# Patient Record
Sex: Male | Born: 1960 | Race: Black or African American | Hispanic: No | Marital: Single | Smoking: Former smoker
Health system: Southern US, Community
[De-identification: ages and names within clinical notes are randomized; demographics above are authoritative.]

## PROBLEM LIST (undated history)

## (undated) ENCOUNTER — Ambulatory Visit (HOSPITAL_COMMUNITY): Admission: EM | Payer: Medicare Other | Source: Home / Self Care

## (undated) DIAGNOSIS — J302 Other seasonal allergic rhinitis: Secondary | ICD-10-CM

## (undated) DIAGNOSIS — R4189 Other symptoms and signs involving cognitive functions and awareness: Secondary | ICD-10-CM

## (undated) DIAGNOSIS — K029 Dental caries, unspecified: Secondary | ICD-10-CM

## (undated) DIAGNOSIS — F419 Anxiety disorder, unspecified: Secondary | ICD-10-CM

## (undated) DIAGNOSIS — M1990 Unspecified osteoarthritis, unspecified site: Secondary | ICD-10-CM

## (undated) HISTORY — PX: NO PAST SURGERIES: SHX2092

---

## 2001-06-21 ENCOUNTER — Encounter: Payer: Self-pay | Admitting: Emergency Medicine

## 2001-06-21 ENCOUNTER — Emergency Department (HOSPITAL_COMMUNITY): Admission: EM | Admit: 2001-06-21 | Discharge: 2001-06-21 | Payer: Self-pay | Admitting: Emergency Medicine

## 2003-09-22 ENCOUNTER — Emergency Department (HOSPITAL_COMMUNITY): Admission: EM | Admit: 2003-09-22 | Discharge: 2003-09-22 | Payer: Self-pay | Admitting: Emergency Medicine

## 2008-03-02 ENCOUNTER — Emergency Department (HOSPITAL_COMMUNITY): Admission: EM | Admit: 2008-03-02 | Discharge: 2008-03-02 | Payer: Self-pay | Admitting: Emergency Medicine

## 2008-04-02 ENCOUNTER — Emergency Department (HOSPITAL_COMMUNITY): Admission: EM | Admit: 2008-04-02 | Discharge: 2008-04-02 | Payer: Self-pay | Admitting: Emergency Medicine

## 2009-01-24 ENCOUNTER — Emergency Department (HOSPITAL_COMMUNITY): Admission: EM | Admit: 2009-01-24 | Discharge: 2009-01-24 | Payer: Self-pay | Admitting: Emergency Medicine

## 2009-07-07 ENCOUNTER — Emergency Department (HOSPITAL_COMMUNITY): Admission: EM | Admit: 2009-07-07 | Discharge: 2009-07-07 | Payer: Self-pay | Admitting: Emergency Medicine

## 2014-09-11 ENCOUNTER — Other Ambulatory Visit: Payer: Self-pay | Admitting: Orthopedic Surgery

## 2014-09-14 ENCOUNTER — Other Ambulatory Visit: Payer: Self-pay | Admitting: Orthopedic Surgery

## 2014-09-18 ENCOUNTER — Encounter (HOSPITAL_COMMUNITY): Payer: Self-pay

## 2014-09-18 ENCOUNTER — Ambulatory Visit (HOSPITAL_COMMUNITY)
Admission: RE | Admit: 2014-09-18 | Discharge: 2014-09-18 | Disposition: A | Discharge status: Home / Self Care | Payer: Medicare Other | Source: Ambulatory Visit | Attending: Orthopedic Surgery | Admitting: Orthopedic Surgery

## 2014-09-18 ENCOUNTER — Encounter (HOSPITAL_COMMUNITY)
Admission: RE | Admit: 2014-09-18 | Discharge: 2014-09-18 | Disposition: A | Discharge status: Home / Self Care | Payer: Medicare Other | Source: Ambulatory Visit | Attending: Orthopedic Surgery | Admitting: Orthopedic Surgery

## 2014-09-18 DIAGNOSIS — M879 Osteonecrosis, unspecified: Secondary | ICD-10-CM | POA: Insufficient documentation

## 2014-09-18 DIAGNOSIS — Z01818 Encounter for other preprocedural examination: Secondary | ICD-10-CM | POA: Diagnosis not present

## 2014-09-18 DIAGNOSIS — Z0183 Encounter for blood typing: Secondary | ICD-10-CM | POA: Diagnosis not present

## 2014-09-18 DIAGNOSIS — Z01812 Encounter for preprocedural laboratory examination: Secondary | ICD-10-CM | POA: Insufficient documentation

## 2014-09-18 HISTORY — DX: Other seasonal allergic rhinitis: J30.2

## 2014-09-18 HISTORY — DX: Unspecified osteoarthritis, unspecified site: M19.90

## 2014-09-18 LAB — BASIC METABOLIC PANEL
ANION GAP: 9 (ref 5–15)
BUN: 13 mg/dL (ref 6–23)
CO2: 26 mmol/L (ref 19–32)
Calcium: 8.9 mg/dL (ref 8.4–10.5)
Chloride: 101 mmol/L (ref 96–112)
Creatinine, Ser: 0.95 mg/dL (ref 0.50–1.35)
GLUCOSE: 89 mg/dL (ref 70–99)
POTASSIUM: 3.9 mmol/L (ref 3.5–5.1)
SODIUM: 136 mmol/L (ref 135–145)

## 2014-09-18 LAB — URINALYSIS, ROUTINE W REFLEX MICROSCOPIC
Bilirubin Urine: NEGATIVE
GLUCOSE, UA: NEGATIVE mg/dL
HGB URINE DIPSTICK: NEGATIVE
Ketones, ur: NEGATIVE mg/dL
LEUKOCYTES UA: NEGATIVE
Nitrite: NEGATIVE
Protein, ur: NEGATIVE mg/dL
SPECIFIC GRAVITY, URINE: 1.021 (ref 1.005–1.030)
UROBILINOGEN UA: 1 mg/dL (ref 0.0–1.0)
pH: 6.5 (ref 5.0–8.0)

## 2014-09-18 LAB — PROTIME-INR
INR: 1.03 (ref 0.00–1.49)
Prothrombin Time: 13.6 seconds (ref 11.6–15.2)

## 2014-09-18 LAB — ABO/RH: ABO/RH(D): O POS

## 2014-09-18 LAB — CBC WITH DIFFERENTIAL/PLATELET
BASOS PCT: 0 % (ref 0–1)
Basophils Absolute: 0 10*3/uL (ref 0.0–0.1)
Eosinophils Absolute: 0.1 10*3/uL (ref 0.0–0.7)
Eosinophils Relative: 1 % (ref 0–5)
HEMATOCRIT: 39.2 % (ref 39.0–52.0)
HEMOGLOBIN: 13.4 g/dL (ref 13.0–17.0)
LYMPHS ABS: 1.9 10*3/uL (ref 0.7–4.0)
Lymphocytes Relative: 29 % (ref 12–46)
MCH: 34 pg (ref 26.0–34.0)
MCHC: 34.2 g/dL (ref 30.0–36.0)
MCV: 99.5 fL (ref 78.0–100.0)
MONO ABS: 0.6 10*3/uL (ref 0.1–1.0)
MONOS PCT: 9 % (ref 3–12)
NEUTROS ABS: 4 10*3/uL (ref 1.7–7.7)
Neutrophils Relative %: 61 % (ref 43–77)
Platelets: 307 10*3/uL (ref 150–400)
RBC: 3.94 MIL/uL — AB (ref 4.22–5.81)
RDW: 11.5 % (ref 11.5–15.5)
WBC: 6.6 10*3/uL (ref 4.0–10.5)

## 2014-09-18 LAB — TYPE AND SCREEN
ABO/RH(D): O POS
Antibody Screen: NEGATIVE

## 2014-09-18 LAB — SURGICAL PCR SCREEN
MRSA, PCR: NEGATIVE
Staphylococcus aureus: NEGATIVE

## 2014-09-18 LAB — APTT: APTT: 32 s (ref 24–37)

## 2014-09-18 NOTE — Pre-Procedure Instructions (Signed)
Dale BullsHarold J Ross  09/18/2014   Your procedure is scheduled on:  Wednesday, May 4.              Report to [facility] Admitting at 10:45 AM.   Call this number if you have problems the morning of surgery: [phone]                For any other questions, please call [phone], Monday - Friday 8 AM - 4 PM.   Remember:   Do not eat food or drink liquids after midnight Tuesday, May 4.   Take these medicines the morning of surgery with A SIP OF WATER: -   Do not wear jewelry, make-up or nail polish.  Do not wear lotions, powders, or perfumes.               Men may shave face and neck.  Do not bring valuables to the hospital.             [facility] is not responsible for any belongings or valuables.               Contacts, dentures or bridgework may not be worn into surgery.  Leave suitcase in the car. After surgery it may be brought to your room.  For patients admitted to the hospital, discharge time is determined by your treatment team.                 Special Instructions: Review  [place] - Preparing For Surgery.   Please read over the following fact sheets that you were given: Pain Booklet, Coughing and Deep Breathing, Blood Transfusion Information and Surgical Site Infection Prevention and Incentive Spirometry

## 2014-09-18 NOTE — Pre-Procedure Instructions (Signed)
Dale BullsHarold J Ross  09/18/2014   Your procedure is scheduled on:  Wednesday, May 4.              [facility] at 10:45 AM.   Call this number if you have problems the morning of surgery: [phone]                For any other questions, please call [phone], Monday - Friday 8 AM - 4 PM.   Remember:   Do not eat food or drink liquids after midnight Tuesday, May 4.   Take these medicines the morning of surgery with A SIP OF WATER: - Tylenol.                 Stop taking nabumetone (RELAFEN) on Wednesday, April 27.               Men may shave face and neck.  Do not bring valuables to the hospital.             [facility] is not responsible              Contacts, dentures or bridgework may not be worn into surgery.  Leave suitcase in the car. After surgery it may be brought to your room.  For patients admitted to the hospital, discharge time is determined by your treatment team.                 Special Instructions: Review  [place] - Preparing For Surgery.   Please read over the following fact sheets that you were given: Pain Booklet, Coughing and Deep Breathing, Blood Transfusion Information and Surgical Site Infection Prevention and Incentive Spirometry

## 2014-09-29 MED ORDER — CEFAZOLIN SODIUM-DEXTROSE 2-3 GM-% IV SOLR
2.0000 g | INTRAVENOUS | Status: AC
  Administered 2014-09-30: 2 g via INTRAVENOUS
  Filled 2014-09-29: qty 50

## 2014-09-29 MED ORDER — DEXTROSE-NACL 5-0.45 % IV SOLN: INTRAVENOUS | Status: DC

## 2014-09-29 NOTE — H&P (Signed)
TOTAL HIP ADMISSION H&P  Patient is admitted for left total hip arthroplasty.  Subjective:  Chief Complaint: left hip pain  HPI: Dale Ross, 54 y.o. male, has a history of pain and functional disability in the left hip(s) due to arthritis and patient has failed non-surgical conservative treatments for greater than 12 weeks to include NSAID's and/or analgesics, flexibility and strengthening excercises, weight reduction as appropriate and activity modification.  Onset of symptoms was gradual starting several years ago with gradually worsening course since that time.The patient noted no past surgery on the left hip(s).  Patient currently rates pain in the left hip at 10 out of 10 with activity. Patient has night pain, worsening of pain with activity and weight bearing, trendelenberg gait, pain that interfers with activities of daily living and pain with passive range of motion. Patient has evidence of joint space narrowing by imaging studies. This condition presents safety issues increasing the risk of falls. This patient has had AVN.  There is no current active infection.  There are no active problems to display for this patient.  Past Medical History  Diagnosis Date  . Seasonal allergies   . Arthritis     Past Surgical History  Procedure Laterality Date  . No past surgeries      No prescriptions prior to admission   No Known Allergies  History  Substance Use Topics  . Smoking status: Current Every Day Smoker -- 1.00 packs/day for 15 years  . Smokeless tobacco: Not on file  . Alcohol Use: 2.4 oz/week    4 Cans of beer per week    No family history on file.   Review of Systems  Constitutional: Negative.   HENT:       Sinus problems  Eyes: Negative.   Respiratory: Negative.   Cardiovascular: Negative.   Gastrointestinal: Negative.   Genitourinary: Negative.   Musculoskeletal: Positive for myalgias and joint pain.  Skin: Negative.   Neurological: Negative.    Psychiatric/Behavioral: Negative.     Objective:  Physical Exam  Constitutional: He is oriented to person, place, and time. He appears well-developed and well-nourished.  HENT:  Head: Normocephalic and atraumatic.  Eyes: Pupils are equal, round, and reactive to light.  Neck: Normal range of motion. Neck supple.  Cardiovascular: Intact distal pulses.   Respiratory: Effort normal.  Musculoskeletal: He exhibits tenderness.  The patient has a severely antalgic gait on the left side.  Any attempts at internal or external rotation causes severe pain.  His skin is intact he has normal sensation of the lower extremities.  Normal pulses to the foot no cuts.  Scrapes or abrasions.  Full range of motion of the knees, no effusions.  The right hip does not have any irritability unlike the left.    Neurological: He is alert and oriented to person, place, and time.  Skin: Skin is warm and dry.  Psychiatric: He has a normal mood and affect. His behavior is normal. Judgment and thought content normal.    Vital signs in last 24 hours:    Labs:   There is no height or weight on file to calculate BMI.   Imaging Review X-rays and MRI scan are reviewed and except for the femoral head, there is no abnormality to the left hip.  Assessment/Plan:  End stage arthritis and AVN, left hip(s)  The patient history, physical examination, clinical judgement of the provider and imaging studies are consistent with end stage degenerative joint disease of the left hip(s) and  total hip arthroplasty is deemed medically necessary. The treatment options including medical management, injection therapy, arthroscopy and arthroplasty were discussed at length. The risks and benefits of total hip arthroplasty were presented and reviewed. The risks due to aseptic loosening, infection, stiffness, dislocation/subluxation,  thromboembolic complications and other imponderables were discussed.  The patient acknowledged the  explanation, agreed to proceed with the plan and consent was signed. Patient is being admitted for inpatient treatment for surgery, pain control, PT, OT, prophylactic antibiotics, VTE prophylaxis, progressive ambulation and ADL's and discharge planning.The patient is planning to be discharged home with home health services

## 2014-09-30 ENCOUNTER — Inpatient Hospital Stay (HOSPITAL_COMMUNITY)
Admission: RE | Admit: 2014-09-30 | Discharge: 2014-10-02 | DRG: 470 | Disposition: A | Discharge status: Skilled Nursing Facility | Payer: Medicare Other | Source: Ambulatory Visit | Attending: Orthopedic Surgery | Admitting: Orthopedic Surgery

## 2014-09-30 ENCOUNTER — Inpatient Hospital Stay (HOSPITAL_COMMUNITY): Payer: Medicare Other | Admitting: Anesthesiology

## 2014-09-30 ENCOUNTER — Encounter (HOSPITAL_COMMUNITY): Payer: Self-pay | Admitting: *Deleted

## 2014-09-30 ENCOUNTER — Inpatient Hospital Stay (HOSPITAL_COMMUNITY): Payer: Medicare Other

## 2014-09-30 ENCOUNTER — Inpatient Hospital Stay (HOSPITAL_COMMUNITY): Payer: Medicare Other | Admitting: Vascular Surgery

## 2014-09-30 ENCOUNTER — Encounter (HOSPITAL_COMMUNITY)
Admission: RE | Disposition: A | Discharge status: Skilled Nursing Facility | Payer: Self-pay | Source: Ambulatory Visit | Attending: Orthopedic Surgery

## 2014-09-30 DIAGNOSIS — M1610 Unilateral primary osteoarthritis, unspecified hip: Secondary | ICD-10-CM | POA: Diagnosis present

## 2014-09-30 DIAGNOSIS — D62 Acute posthemorrhagic anemia: Secondary | ICD-10-CM | POA: Diagnosis not present

## 2014-09-30 DIAGNOSIS — Z96649 Presence of unspecified artificial hip joint: Secondary | ICD-10-CM

## 2014-09-30 DIAGNOSIS — Z7982 Long term (current) use of aspirin: Secondary | ICD-10-CM

## 2014-09-30 DIAGNOSIS — M25552 Pain in left hip: Secondary | ICD-10-CM | POA: Diagnosis present

## 2014-09-30 DIAGNOSIS — F17200 Nicotine dependence, unspecified, uncomplicated: Secondary | ICD-10-CM | POA: Diagnosis present

## 2014-09-30 DIAGNOSIS — M87850 Other osteonecrosis, pelvis: Principal | ICD-10-CM | POA: Diagnosis present

## 2014-09-30 DIAGNOSIS — M87052 Idiopathic aseptic necrosis of left femur: Secondary | ICD-10-CM | POA: Diagnosis present

## 2014-09-30 HISTORY — PX: TOTAL HIP ARTHROPLASTY: SHX124

## 2014-09-30 SURGERY — ARTHROPLASTY, HIP, TOTAL,POSTERIOR APPROACH
Anesthesia: Spinal | Laterality: Left

## 2014-09-30 MED ORDER — MIDAZOLAM HCL 5 MG/5ML IJ SOLN
INTRAMUSCULAR | Status: DC | PRN
  Administered 2014-09-30: 2 mg via INTRAVENOUS

## 2014-09-30 MED ORDER — CYCLOBENZAPRINE HCL 10 MG PO TABS: 10.0000 mg | ORAL_TABLET | Freq: Three times a day (TID) | ORAL | Status: DC | PRN

## 2014-09-30 MED ORDER — FENTANYL CITRATE (PF) 250 MCG/5ML IJ SOLN
INTRAMUSCULAR | Status: AC
Start: 2014-09-30 — End: 2014-09-30
  Filled 2014-09-30: qty 5

## 2014-09-30 MED ORDER — LACTATED RINGERS IV SOLN
INTRAVENOUS | Status: DC | PRN
  Administered 2014-09-30 (×2): via INTRAVENOUS

## 2014-09-30 MED ORDER — BUPIVACAINE-EPINEPHRINE 0.5% -1:200000 IJ SOLN
INTRAMUSCULAR | Status: DC | PRN
  Administered 2014-09-30: 8 mL

## 2014-09-30 MED ORDER — FLEET ENEMA 7-19 GM/118ML RE ENEM: 1.0000 | ENEMA | Freq: Once | RECTAL | Status: AC | PRN

## 2014-09-30 MED ORDER — DEXAMETHASONE SODIUM PHOSPHATE 10 MG/ML IJ SOLN
10.0000 mg | Freq: Once | INTRAMUSCULAR | Status: AC
  Administered 2014-10-01: 10 mg via INTRAVENOUS
  Filled 2014-09-30: qty 1

## 2014-09-30 MED ORDER — MENTHOL 3 MG MT LOZG: 1.0000 | LOZENGE | OROMUCOSAL | Status: DC | PRN

## 2014-09-30 MED ORDER — METOCLOPRAMIDE HCL 5 MG PO TABS: 5.0000 mg | ORAL_TABLET | Freq: Three times a day (TID) | ORAL | Status: DC | PRN

## 2014-09-30 MED ORDER — METHOCARBAMOL 500 MG PO TABS
500.0000 mg | ORAL_TABLET | Freq: Four times a day (QID) | ORAL | Status: DC | PRN
  Administered 2014-10-01 – 2014-10-02 (×2): 500 mg via ORAL
  Filled 2014-09-30 (×2): qty 1

## 2014-09-30 MED ORDER — ONDANSETRON HCL 4 MG/2ML IJ SOLN
4.0000 mg | Freq: Four times a day (QID) | INTRAMUSCULAR | Status: DC | PRN
  Administered 2014-09-30 – 2014-10-01 (×2): 4 mg via INTRAVENOUS
  Filled 2014-09-30: qty 2

## 2014-09-30 MED ORDER — EPHEDRINE SULFATE 50 MG/ML IJ SOLN
INTRAMUSCULAR | Status: DC | PRN
  Administered 2014-09-30 (×3): 10 mg via INTRAVENOUS

## 2014-09-30 MED ORDER — OXYCODONE HCL 5 MG PO TABS
5.0000 mg | ORAL_TABLET | ORAL | Status: DC | PRN
  Administered 2014-10-01 – 2014-10-02 (×5): 10 mg via ORAL
  Filled 2014-09-30 (×5): qty 2

## 2014-09-30 MED ORDER — LIDOCAINE HCL (CARDIAC) 20 MG/ML IV SOLN
INTRAVENOUS | Status: DC | PRN
  Administered 2014-09-30: 40 mg via INTRAVENOUS

## 2014-09-30 MED ORDER — LIDOCAINE HCL (CARDIAC) 20 MG/ML IV SOLN
INTRAVENOUS | Status: AC
  Filled 2014-09-30: qty 5

## 2014-09-30 MED ORDER — HYDROMORPHONE HCL 1 MG/ML IJ SOLN
INTRAMUSCULAR | Status: AC
  Filled 2014-09-30: qty 1

## 2014-09-30 MED ORDER — HYDROMORPHONE HCL 1 MG/ML IJ SOLN
0.5000 mg | INTRAMUSCULAR | Status: DC | PRN
  Administered 2014-09-30 – 2014-10-01 (×3): 1 mg via INTRAVENOUS
  Filled 2014-09-30 (×3): qty 1

## 2014-09-30 MED ORDER — DOCUSATE SODIUM 100 MG PO CAPS
100.0000 mg | ORAL_CAPSULE | Freq: Two times a day (BID) | ORAL | Status: DC
  Administered 2014-09-30 – 2014-10-02 (×4): 100 mg via ORAL
  Filled 2014-09-30 (×5): qty 1

## 2014-09-30 MED ORDER — PROPOFOL 10 MG/ML IV BOLUS
INTRAVENOUS | Status: DC | PRN
  Administered 2014-09-30 (×2): 20 mg via INTRAVENOUS

## 2014-09-30 MED ORDER — ASPIRIN EC 325 MG PO TBEC
325.0000 mg | DELAYED_RELEASE_TABLET | Freq: Every day | ORAL | Status: DC
  Administered 2014-10-01 – 2014-10-02 (×2): 325 mg via ORAL
  Filled 2014-09-30 (×2): qty 1

## 2014-09-30 MED ORDER — OXYCODONE-ACETAMINOPHEN 5-325 MG PO TABS: 1.0000 | ORAL_TABLET | ORAL | Status: DC | PRN

## 2014-09-30 MED ORDER — ROCURONIUM BROMIDE 50 MG/5ML IV SOLN
INTRAVENOUS | Status: AC
  Filled 2014-09-30: qty 1

## 2014-09-30 MED ORDER — SENNOSIDES-DOCUSATE SODIUM 8.6-50 MG PO TABS
1.0000 | ORAL_TABLET | Freq: Every evening | ORAL | Status: DC | PRN
Start: 2014-09-30 — End: 2014-10-02

## 2014-09-30 MED ORDER — ASPIRIN EC 325 MG PO TBEC: 325.0000 mg | DELAYED_RELEASE_TABLET | Freq: Two times a day (BID) | ORAL | Status: DC

## 2014-09-30 MED ORDER — FENTANYL CITRATE (PF) 100 MCG/2ML IJ SOLN
INTRAMUSCULAR | Status: DC | PRN
  Administered 2014-09-30: 50 ug via INTRAVENOUS

## 2014-09-30 MED ORDER — KCL IN DEXTROSE-NACL 20-5-0.45 MEQ/L-%-% IV SOLN
INTRAVENOUS | Status: DC
  Administered 2014-09-30: 125 mL/h via INTRAVENOUS
  Administered 2014-10-01 – 2014-10-02 (×3): via INTRAVENOUS
  Filled 2014-09-30 (×8): qty 1000

## 2014-09-30 MED ORDER — MIDAZOLAM HCL 2 MG/2ML IJ SOLN
INTRAMUSCULAR | Status: AC
  Filled 2014-09-30: qty 2

## 2014-09-30 MED ORDER — HYDROMORPHONE HCL 1 MG/ML IJ SOLN
0.2500 mg | INTRAMUSCULAR | Status: DC | PRN
  Administered 2014-09-30 (×2): 0.5 mg via INTRAVENOUS

## 2014-09-30 MED ORDER — METHOCARBAMOL 1000 MG/10ML IJ SOLN
500.0000 mg | Freq: Four times a day (QID) | INTRAVENOUS | Status: DC | PRN
  Filled 2014-09-30: qty 5

## 2014-09-30 MED ORDER — PHENOL 1.4 % MT LIQD: 1.0000 | OROMUCOSAL | Status: DC | PRN

## 2014-09-30 MED ORDER — ACETAMINOPHEN 650 MG RE SUPP: 650.0000 mg | Freq: Four times a day (QID) | RECTAL | Status: DC | PRN

## 2014-09-30 MED ORDER — PROPOFOL 10 MG/ML IV BOLUS
INTRAVENOUS | Status: AC
Start: 2014-09-30 — End: 2014-09-30
  Filled 2014-09-30: qty 20

## 2014-09-30 MED ORDER — CHLORHEXIDINE GLUCONATE 4 % EX LIQD
60.0000 mL | Freq: Once | CUTANEOUS | Status: DC
  Filled 2014-09-30: qty 60

## 2014-09-30 MED ORDER — PROPOFOL INFUSION 10 MG/ML OPTIME
INTRAVENOUS | Status: DC | PRN
  Administered 2014-09-30: 50 ug/kg/min via INTRAVENOUS

## 2014-09-30 MED ORDER — METOCLOPRAMIDE HCL 5 MG/ML IJ SOLN
5.0000 mg | Freq: Three times a day (TID) | INTRAMUSCULAR | Status: DC | PRN
  Administered 2014-09-30: 10 mg via INTRAVENOUS
  Filled 2014-09-30: qty 2

## 2014-09-30 MED ORDER — BISACODYL 5 MG PO TBEC: 5.0000 mg | DELAYED_RELEASE_TABLET | Freq: Every day | ORAL | Status: DC | PRN

## 2014-09-30 MED ORDER — TRANEXAMIC ACID 1000 MG/10ML IV SOLN
1000.0000 mg | INTRAVENOUS | Status: DC
  Filled 2014-09-30: qty 10

## 2014-09-30 MED ORDER — TRANEXAMIC ACID 1000 MG/10ML IV SOLN
1000.0000 mg | INTRAVENOUS | Status: DC | PRN
  Administered 2014-09-30: 1000 mg via INTRAVENOUS

## 2014-09-30 MED ORDER — ALUMINUM HYDROXIDE GEL 320 MG/5ML PO SUSP
15.0000 mL | ORAL | Status: DC | PRN
  Filled 2014-09-30: qty 30

## 2014-09-30 MED ORDER — SODIUM CHLORIDE 0.9 % IR SOLN
Status: DC | PRN
  Administered 2014-09-30: 1000 mL

## 2014-09-30 MED ORDER — KCL IN DEXTROSE-NACL 20-5-0.45 MEQ/L-%-% IV SOLN
INTRAVENOUS | Status: AC
  Filled 2014-09-30: qty 1000

## 2014-09-30 MED ORDER — DIPHENHYDRAMINE HCL 12.5 MG/5ML PO ELIX: 12.5000 mg | ORAL_SOLUTION | ORAL | Status: DC | PRN

## 2014-09-30 MED ORDER — BUPIVACAINE-EPINEPHRINE (PF) 0.5% -1:200000 IJ SOLN
INTRAMUSCULAR | Status: AC
Start: 2014-09-30 — End: 2014-09-30
  Filled 2014-09-30: qty 30

## 2014-09-30 MED ORDER — ACETAMINOPHEN 325 MG PO TABS
650.0000 mg | ORAL_TABLET | Freq: Four times a day (QID) | ORAL | Status: DC | PRN
Start: 2014-09-30 — End: 2014-10-02

## 2014-09-30 MED ORDER — BUPIVACAINE IN DEXTROSE 0.75-8.25 % IT SOLN
INTRATHECAL | Status: DC | PRN
  Administered 2014-09-30: 12 mg via INTRATHECAL

## 2014-09-30 MED ORDER — ONDANSETRON HCL 4 MG PO TABS: 4.0000 mg | ORAL_TABLET | Freq: Four times a day (QID) | ORAL | Status: DC | PRN

## 2014-09-30 SURGICAL SUPPLY — 54 items
BLADE SAW SGTL 18X1.27X75 (BLADE) ×2 IMPLANT
BLADE SAW SGTL 18X1.27X75MM (BLADE) ×1
BRUSH FEMORAL CANAL (MISCELLANEOUS) IMPLANT
CAPT HIP TOTAL 2 ×3 IMPLANT
COVER BACK TABLE 24X17X13 BIG (DRAPES) IMPLANT
COVER SURGICAL LIGHT HANDLE (MISCELLANEOUS) ×6 IMPLANT
DRAPE IMP U-DRAPE 54X76 (DRAPES) ×3 IMPLANT
DRAPE ORTHO SPLIT 77X108 STRL (DRAPES) ×2
DRAPE PROXIMA HALF (DRAPES) ×3 IMPLANT
DRAPE SURG ORHT 6 SPLT 77X108 (DRAPES) ×1 IMPLANT
DRAPE U-SHAPE 47X51 STRL (DRAPES) ×3 IMPLANT
DRILL BIT 7/64X5 (BIT) ×3 IMPLANT
DRSG AQUACEL AG ADV 3.5X10 (GAUZE/BANDAGES/DRESSINGS) ×3 IMPLANT
DURAPREP 26ML APPLICATOR (WOUND CARE) ×3 IMPLANT
ELECT BLADE 4.0 EZ CLEAN MEGAD (MISCELLANEOUS)
ELECT REM PT RETURN 9FT ADLT (ELECTROSURGICAL) ×3
ELECTRODE BLDE 4.0 EZ CLN MEGD (MISCELLANEOUS) IMPLANT
ELECTRODE REM PT RTRN 9FT ADLT (ELECTROSURGICAL) ×1 IMPLANT
GLOVE BIO SURGEON STRL SZ7.5 (GLOVE) ×3 IMPLANT
GLOVE BIO SURGEON STRL SZ8.5 (GLOVE) ×6 IMPLANT
GLOVE BIOGEL PI IND STRL 8 (GLOVE) ×2 IMPLANT
GLOVE BIOGEL PI IND STRL 9 (GLOVE) ×1 IMPLANT
GLOVE BIOGEL PI INDICATOR 8 (GLOVE) ×4
GLOVE BIOGEL PI INDICATOR 9 (GLOVE) ×2
GOWN STRL REUS W/ TWL LRG LVL3 (GOWN DISPOSABLE) ×2 IMPLANT
GOWN STRL REUS W/ TWL XL LVL3 (GOWN DISPOSABLE) ×3 IMPLANT
GOWN STRL REUS W/TWL LRG LVL3 (GOWN DISPOSABLE) ×4
GOWN STRL REUS W/TWL XL LVL3 (GOWN DISPOSABLE) ×6
HANDPIECE INTERPULSE COAX TIP (DISPOSABLE)
HOOD PEEL AWAY FACE SHEILD DIS (HOOD) ×6 IMPLANT
KIT BASIN OR (CUSTOM PROCEDURE TRAY) ×3 IMPLANT
KIT ROOM TURNOVER OR (KITS) ×3 IMPLANT
MANIFOLD NEPTUNE II (INSTRUMENTS) ×3 IMPLANT
NEEDLE 22X1 1/2 (OR ONLY) (NEEDLE) ×3 IMPLANT
NS IRRIG 1000ML POUR BTL (IV SOLUTION) ×3 IMPLANT
PACK TOTAL JOINT (CUSTOM PROCEDURE TRAY) ×3 IMPLANT
PACK UNIVERSAL I (CUSTOM PROCEDURE TRAY) ×3 IMPLANT
PAD ARMBOARD 7.5X6 YLW CONV (MISCELLANEOUS) ×6 IMPLANT
PASSER SUT SWANSON 36MM LOOP (INSTRUMENTS) ×3 IMPLANT
PRESSURIZER FEMORAL UNIV (MISCELLANEOUS) IMPLANT
SET HNDPC FAN SPRY TIP SCT (DISPOSABLE) IMPLANT
SUT ETHIBOND 2 V 37 (SUTURE) ×3 IMPLANT
SUT VIC AB 0 CTB1 27 (SUTURE) ×3 IMPLANT
SUT VIC AB 1 CTX 36 (SUTURE) ×2
SUT VIC AB 1 CTX36XBRD ANBCTR (SUTURE) ×1 IMPLANT
SUT VIC AB 2-0 CTB1 (SUTURE) ×3 IMPLANT
SUT VIC AB 3-0 SH 27 (SUTURE) ×2
SUT VIC AB 3-0 SH 27X BRD (SUTURE) ×1 IMPLANT
SYR CONTROL 10ML LL (SYRINGE) ×3 IMPLANT
TOWEL OR 17X24 6PK STRL BLUE (TOWEL DISPOSABLE) ×3 IMPLANT
TOWEL OR 17X26 10 PK STRL BLUE (TOWEL DISPOSABLE) ×3 IMPLANT
TOWER CARTRIDGE SMART MIX (DISPOSABLE) IMPLANT
TRAY FOLEY CATH 14FR (SET/KITS/TRAYS/PACK) IMPLANT
WATER STERILE IRR 1000ML POUR (IV SOLUTION) ×12 IMPLANT

## 2014-09-30 NOTE — Transfer of Care (Signed)
Immediate Anesthesia Transfer of Care Note  Patient: Dale Ross  Procedure(s) Performed: Procedure(s): TOTAL HIP ARTHROPLASTY (Left)  Patient Location: PACU  Anesthesia Type:MAC and Spinal  Level of Consciousness: awake and alert   Airway & Oxygen Therapy: Patient Spontanous Breathing and Patient connected to nasal cannula oxygen  Post-op Assessment: Report given to RN and Post -op Vital signs reviewed and stable  Post vital signs: Reviewed and stable  Last Vitals:  Filed Vitals:   09/30/14 1033  BP: 122/77  Pulse: 68  Temp: 36.7 C  Resp: 18    Complications: No apparent anesthesia complications

## 2014-09-30 NOTE — Anesthesia Preprocedure Evaluation (Addendum)
Anesthesia Evaluation  Patient identified by MRN, date of birth, ID band Patient awake    Reviewed: Allergy & Precautions, NPO status , Patient's Chart, lab work & pertinent test results  Airway Mallampati: II  TM Distance: >3 FB Neck ROM: Full    Dental  (+) Poor Dentition, Missing, Dental Advisory Given   Pulmonary Current Smoker,    Pulmonary exam normal       Cardiovascular Normal cardiovascular exam    Neuro/Psych    GI/Hepatic   Endo/Other    Renal/GU      Musculoskeletal   Abdominal   Peds  Hematology   Anesthesia Other Findings   Reproductive/Obstetrics                            Anesthesia Physical Anesthesia Plan  ASA: II  Anesthesia Plan: Spinal   Post-op Pain Management:    Induction:   Airway Management Planned: Nasal Cannula  Additional Equipment:   Intra-op Plan:   Post-operative Plan:   Informed Consent:   Dental advisory given  Plan Discussed with: CRNA, Anesthesiologist and Surgeon  Anesthesia Plan Comments:         Anesthesia Quick Evaluation

## 2014-09-30 NOTE — Discharge Instructions (Signed)

## 2014-09-30 NOTE — Anesthesia Procedure Notes (Addendum)
Procedure Name: MAC Date/Time: 09/30/2014 1:30 PM Performed by: Charm BargesBUTLER, DAVID R Pre-anesthesia Checklist: Patient identified, Suction available, Emergency Drugs available, Patient being monitored and Timeout performed Patient Re-evaluated:Patient Re-evaluated prior to inductionOxygen Delivery Method: Nasal cannula Placement Confirmation: positive ETCO2 Dental Injury: Teeth and Oropharynx as per pre-operative assessment    Spinal Patient location during procedure: OR Start time: 09/30/2014 5:46 PM End time: 09/30/2014 1:35 PM Staffing Anesthesiologist: Heather RobertsSINGER, Binnie Vonderhaar Performed by: anesthesiologist  Preanesthetic Checklist Completed: patient identified, surgical consent, pre-op evaluation, timeout performed, IV checked, risks and benefits discussed and monitors and equipment checked Spinal Block Patient position: sitting Prep: ChloraPrep Patient monitoring: cardiac monitor, continuous pulse ox and blood pressure Approach: midline Location: L3-4 Injection technique: single-shot Needle Needle type: Pencan  Needle gauge: 24 G Needle length: 9 cm Additional Notes Functioning IV was confirmed and monitors were applied. Sterile prep and drape, including hand hygiene and sterile gloves were used. The patient was positioned and the spine was prepped. The skin was anesthetized with lidocaine.  Free flow of clear CSF was obtained prior to injecting local anesthetic into the CSF.  The spinal needle aspirated freely following injection.  The needle was carefully withdrawn.  The patient tolerated the procedure well.

## 2014-09-30 NOTE — Anesthesia Postprocedure Evaluation (Signed)
Anesthesia Post Note  Patient: Dale Ross  Procedure(s) Performed: Procedure(s) (LRB): TOTAL HIP ARTHROPLASTY (Left)  Anesthesia type: MAC/SAB  Patient location: PACU  Post pain: Pain level controlled  Post assessment: Patient's Cardiovascular Status Stable  Last Vitals:  Filed Vitals:   09/30/14 1728  BP:   Pulse:   Temp: 36.3 C  Resp:     Post vital signs: Reviewed and stable  Level of consciousness: sedated  Complications: No apparent anesthesia complications

## 2014-09-30 NOTE — Progress Notes (Signed)
Utilization review completed.  

## 2014-09-30 NOTE — Interval H&P Note (Signed)
History and Physical Interval Note:  09/30/2014 11:49 AM  Dale BullsHarold J Dymek  has presented today for surgery, with the diagnosis of LEFT HIP AVASCULAR NECROSIS  The various methods of treatment have been discussed with the patient and family. After consideration of risks, benefits and other options for treatment, the patient has consented to  Procedure(s): TOTAL HIP ARTHROPLASTY (Left) as a surgical intervention .  The patient's history has been reviewed, patient examined, no change in status, stable for surgery.  I have reviewed the patient's chart and labs.  Questions were answered to the patient's satisfaction.     Nestor LewandowskyOWAN,Russell Quinney J

## 2014-09-30 NOTE — Op Note (Signed)
OPERATIVE REPORT    DATE OF PROCEDURE:  09/30/2014       PREOPERATIVE DIAGNOSIS:  LEFT HIP AVASCULAR NECROSIS                                                          POSTOPERATIVE DIAGNOSIS:  LEFT HIP AVASCULAR NECROSIS                                                           PROCEDURE:  L total hip arthroplasty using a 52 mm DePuy Pinnacle  Cup, Peabody E[employer] Hole Eliminator, 10-degree polyethylene liner index superior  and posterior, a +3 36 mm ceramic head, a 20x15x36x150 SROM stem, 20Dsm Sleeve   SURGEON: Zayven Powe J    ASSISTANT:   Eric K. [employer] PA-C  (present throughout entire procedure and necessary for timely completion of the procedure)   ANESTHESIA: Spinal BLOOD LOSS: 300 FLUID REPLACEMENT: 1600 crystalloid Antibiotic: 2gm ancef Tranexamic Acid: 1 gm IV COMPLICATIONS: none    INDICATIONS FOR PROCEDURE: A 54 y.o. year-old With  LEFT HIP AVASCULAR NECROSIS   for 4 years, x-rays show bone-on-bone arthritic changes. Despite conservative measures with observation, anti-inflammatory medicine, narcotics, use of a cane, has severe unremitting pain and can ambulate only a few blocks before resting.  Patient desires elective L total hip arthroplasty to decrease pain and increase function. The risks, benefits, and alternatives were discussed at length including but not limited to the risks of infection, bleeding, nerve injury, stiffness, blood clots, the need for revision surgery, cardiopulmonary complications, among others, and they were willing to proceed. Questions answered     PROCEDURE IN DETAIL: The patient was identified by armband,  received preoperative IV antibiotics [facility] holding [facility], taken to the operating room , appropriate anesthetic monitors  were attached and general endotracheal anesthesia induced. Pt was rolled into the R lateral decubitus position and fixed there with a Stulberg Mark II pelvic clamp.  The L lower extremity was then prepped and  draped  in the usual sterile fashion from the ankle to the hemipelvis. A time-out  procedure was performed. The skin along the lateral hip and thigh  infiltrated with 10 mL of 0.5% Marcaine and epinephrine solution. We  then made a posterolateral approach to the hip. With a #10 blade, a 15 cm  incision was made through the skin and subcutaneous tissue down to the level of the  IT band. Small bleeders were identified and cauterized. The IT band was cut in  line with skin incision exposing the greater trochanter. A Cobra retractor was placed between the gluteus minimus and the superior hip joint capsule, and a spiked Cobra between the quadratus femoris and the inferior hip joint capsule. This isolated the short  external rotators and piriformis tendons. These were tagged with a #2 Ethibond  suture and cut off their insertion on the intertrochanteric crest. The posterior  capsule was then developed into an acetabular-based flap from Posterior Superior off of the acetabulum out over the femoral neck and back posterior inferior to the acetabular rim. This flap was tagged with two #2  Ethibond sutures and retracted protecting the sciatic nerve. This exposed the arthritic femoral head and osteophytes. The hip was then flexed and internally rotated, dislocating the femoral head and a standard neck cut performed 1 fingerbreadth above the lesser trochanter.  A spiked Cobra was placed in the cotyloid notch and a Hohmann retractor was then used to lever the femur anteriorly off of the anterior pelvic column. A posterior-inferior wing retractor was placed at the junction of the acetabulum and the ischium completing the acetabular exposure.We then removed the peripheral osteophytes and labrum from the acetabulum. We then reamed the acetabulum up to 51 mm with basket reamers obtaining good coverage in all quadrants. We then irrigated with normal  saline solution and hammered into place a 52 mm pinnacle cup in 45   degrees of abduction and about 25 degrees of anteversion. More  peripheral osteophytes removed and a trial 10-degree liner placed with the  index superior-posterior. The hip was then flexed and internally rotated exposing the  proximal femur, which was entered with the initiating reamer followed by  the axial reamers up to a 15.5 mm full depth and 16mm partial depth. We then conically reamed to 20D to the correct depth for a 36 base neck. The calcar was milled to 20Dsm. A trial sleeve and stem was inserted in the 20 degrees anteversion, with a +0 36mm trial head. Trial reduction was then performed and excellent stability was noted with at 90 of flexion with 75 of internal rotation and then full extension with maximal external rotation. The hip could not be dislocated in full extension. The knee could easily flex  to about 130 degrees. We also stretched the abductors at this point,  because of the preexisting adductor contractures. All trial components  were then removed. The acetabulum was irrigated out with normal saline  solution. A titanium [facility] Eliminator was then screwed into place  followed by a 10-degree polyethylene liner index superior-posterior. On  the femoral side a 20Dsm ZTT1 sleeve was hammered into place, followed by a 20x15x150x36 SROM stem in 25 degrees of anteversion. At this point, a +3 36 mm ceramic head was  hammered on the stem. The hip was reduced. We checked our stability  one more time and found it to be excellent. The wound was once again  thoroughly irrigated out with normal saline solution hand lavage. The  capsular flap and short external rotators were repaired back to the  intertrochanteric crest through drill holes with a #2 Ethibond suture.  The IT band was closed with running 1 Vicryl suture. The subcutaneous  tissue with 0 and 2-0 undyed Vicryl suture and the skin with running  3-0 vicryl subcuticular suture. Aquacil dressing was applied. The patient was  then unclamped, rolled supine, awaken extubated and taken to recovery room without difficulty in stable condition.   Darice Vicario J 09/30/2014, 2:41 PM

## 2014-10-01 ENCOUNTER — Encounter (HOSPITAL_COMMUNITY): Payer: Self-pay | Admitting: Orthopedic Surgery

## 2014-10-01 LAB — BASIC METABOLIC PANEL WITH GFR
Anion gap: 10 (ref 5–15)
BUN: 6 mg/dL (ref 6–20)
CO2: 24 mmol/L (ref 22–32)
Calcium: 8.5 mg/dL — ABNORMAL LOW (ref 8.9–10.3)
Chloride: 101 mmol/L (ref 101–111)
Creatinine, Ser: 0.93 mg/dL (ref 0.61–1.24)
GFR calc Af Amer: >60 mL/min
GFR calc non Af Amer: >60 mL/min
Glucose, Bld: 138 mg/dL — ABNORMAL HIGH (ref 70–99)
Potassium: 4.6 mmol/L (ref 3.5–5.1)
Sodium: 135 mmol/L (ref 135–145)

## 2014-10-01 LAB — CBC
HCT: 34.4 % — ABNORMAL LOW (ref 39.0–52.0)
Hemoglobin: 11.5 g/dL — ABNORMAL LOW (ref 13.0–17.0)
MCH: 33.4 pg (ref 26.0–34.0)
MCHC: 33.4 g/dL (ref 30.0–36.0)
MCV: 100 fL (ref 78.0–100.0)
Platelets: 354 K/uL (ref 150–400)
RBC: 3.44 MIL/uL — ABNORMAL LOW (ref 4.22–5.81)
RDW: 11.6 % (ref 11.5–15.5)
WBC: 9.7 K/uL (ref 4.0–10.5)

## 2014-10-01 NOTE — Progress Notes (Signed)
Occupational Therapy Evaluation Patient Details Name: Dale Ross MRN: [id number] DOB: [birth year] Today's Date: 10/01/2014    History of Present Illness Pt is a 54 y/o M s/p L THA, posterior approach.  Pt has no significant medical history on file.   Clinical Impression   Eval limited due to nausea/active vomiting. Patient presents with decreased ADL independence and safety and posterior hip precautions. He does not have 24/7 help. His sister can assist occasionally, but patient reports he does not want her to help him with personal care like bathing. Patient reports he lives with a friend, "Mr. Juanda Crumble," who is blind and patient helps Mr. Juanda Crumble with bathing, dressing, and changing his Pullup. He will not be safe to return home at this time and recommend SNF. Patient willing to go to SNF for rehab if that is what is recommended.    Follow Up Recommendations  SNF;Supervision/Assistance - 24 hour    Equipment Recommendations  3 in 1 bedside comode;Other (comment) (long handled adaptive equipment)    Recommendations for Other Services       Precautions / Restrictions Precautions Precautions: Posterior Hip;Fall Precaution Booklet Issued: Yes (comment) Precaution Comments: Reviewed 3/3 precautions using teachback and pt was only able to recall 2/3 precautions, despite reviewing precautions multiple times.   Restrictions Weight Bearing Restrictions: Yes LLE Weight Bearing: Weight bearing as tolerated      Mobility Bed Mobility Overal bed mobility: Needs Assistance Bed Mobility: Supine to Sit     Supine to sit: Min assist     General bed mobility comments: Assist w/ managing BLEs to sitting EOB, min use of bed rails, and cues for sequencing  Transfers Overall transfer level: Needs assistance Equipment used: Rolling walker (2 wheeled) Transfers: Sit to/from Stand Sit to Stand: Min guard         General transfer comment: Cues for proper hand placement and to push from  bed.  Cues to stand upright once standing EOB.    Balance Overall balance assessment: Needs assistance Sitting-balance support: No upper extremity supported;Feet supported Sitting balance-Leahy Scale: Fair     Standing balance support: Bilateral upper extremity supported;During functional activity Standing balance-Leahy Scale: Fair                              ADL Overall ADL's : Needs assistance/impaired Eating/Feeding: Set up;Sitting   Grooming: Wash/dry hands;Wash/dry face;Set up;Sitting   Upper Body Bathing: Minimal assitance;Sitting   Lower Body Bathing: Maximal assistance;Sit to/from stand   Upper Body Dressing : Minimal assistance;Sitting   Lower Body Dressing: Maximal assistance;Sit to/from stand               Functional mobility during ADLs:  (Patient declined mobility due to active N/V.) General ADL Comments: Patient received sitting up in recliner. Only able to recall 2/3 hip precautions with cues, and had just been over them several times with PT. Educated patient on how ADLs are affected with posterior hip precautions. Patient would either need someone to assist with LB bathing/dressing or purchase hip kit. Patient reports not comfortable with sister assisting him with ADLs, and she will not be available all of the time anyway. Patient reports he lives with a friend "Mr. Juanda Crumble" who is blind. The friend is unable to assist the patient and patient reports that he helps Mr. Juanda Crumble with bathing, dressing, and changing his Pull-Up. Patient educated that he will not be able to assist Mr. Juanda Crumble right now  due to hip precautions. Patient verbalized understanding. Is willing to go to SNF for rehab if that is what is recommended. Patient actively vomiting during OT eval and declined mobility. Observed grooming activities with setup seated in chair.      Vision     Perception     Praxis      Pertinent Vitals/Pain Pain Assessment: Faces Faces Pain Scale:  Hurts little more Pain Location: L hip Pain Descriptors / Indicators: Sore;Aching Pain Intervention(s): Limited activity within patient's tolerance     Hand Dominance Right   Extremity/Trunk Assessment Upper Extremity Assessment Upper Extremity Assessment: Overall WFL for tasks assessed   Lower Extremity Assessment Lower Extremity Assessment: Defer to PT evaluation LLE Deficits / Details: weakness and limited ROM as expected s/p L THA       Communication Communication Communication: No difficulties   Cognition Arousal/Alertness: Awake/alert Behavior During Therapy: WFL for tasks assessed/performed Overall Cognitive Status: No family/caregiver present to determine baseline cognitive functioning       Memory: Decreased recall of precautions;Decreased short-term memory             General Comments       Exercises Exercises: Total Joint     Shoulder Instructions      Home Living Family/patient expects to be discharged to:: Private residence Living Arrangements: Non-relatives/Friends Available Help at Discharge: Friend(s);Family;Available PRN/intermittently (his friend is blind and cannot assist patient.) Type of Home: Apartment Home Access: Level entry     Home Layout: One level     Bathroom Shower/Tub: Teacher, Cohan Stipes years/pre: Standard     Home Equipment: None   Additional Comments: Pt is a poor historian and has difficulty answering questions consistently and has poor short term memory.      Prior Functioning/Environment Level of Independence: Independent        Comments: Patient assisted his friend with bathing, dressing, and changing his Pull Up     OT Diagnosis: Acute pain   OT Problem List: Decreased strength;Decreased cognition;Decreased safety awareness;Decreased knowledge of use of DME or AE;Decreased knowledge of precautions;Pain   OT Treatment/Interventions: Self-care/ADL training;DME and/or AE instruction;Therapeutic  activities;Patient/family education    OT Goals(Current goals can be found in the care plan section) Acute Rehab OT Goals Patient Stated Goal: none stated OT Goal Formulation: With patient Time For Goal Achievement: 10/15/14 Potential to Achieve Goals: Good  OT Frequency: Min 2X/week   Barriers to D/C: Decreased caregiver support;Other (comment) (cognitive status)          Co-evaluation              End of Session    Activity Tolerance: Other (comment) (limited by N/V) Patient left: in chair;with call bell/phone within reach;with chair alarm set   Time: 0954-1010 OT Time Calculation (min): 16 min Charges:  OT General Charges $OT Visit: 1 Procedure OT Evaluation $Initial OT Evaluation Tier I: 1 Procedure G-Codes:    Sujata Maines A 2014/10/09, 10:39 AM

## 2014-10-01 NOTE — Progress Notes (Signed)
Patient ID: Dale Ross, male   DOB: 11/22/1960, 54 y.o.   MRN: [id number] PATIENT ID: Dale BullsHarold J Ross  MRN: [id number]  DOB/AGE:  02/17/1961 / 54 y.o.  1 Day Post-Op Procedure(s) (LRB): TOTAL HIP ARTHROPLASTY (Left)    PROGRESS NOTE Subjective: Patient is alert, oriented, 2x Nausea, 2x Vomiting, yes passing gas, no Bowel Movement. Taking PO sips. Denies SOB, Chest or Calf Pain. Using Incentive Spirometer, PAS in place. Ambulate WBAT Patient reports pain as 6 on 0-10 scale  .    Objective: Vital signs in last 24 hours: Filed Vitals:   09/30/14 1728 09/30/14 2007 10/01/14 0129 10/01/14 0534  BP:  115/72 126/78 117/76  Pulse:  101 98 99  Temp: 97.4 F (36.3 C) 97.8 F (36.6 C) 98.3 F (36.8 C) 98.5 F (36.9 C)  TempSrc:  Oral Oral   Resp:    16  Weight:      SpO2:  100% 100% 100%      Intake/Output from previous day: I/O last 3 completed shifts: In: 3120 [P.O.:120; I.V.:3000] Out: 650 [Urine:650]   Intake/Output this shift: Total I/O In: 240 [P.O.:240] Out: 350 [Urine:350]   LABORATORY DATA:  Recent Labs  10/01/14 0417  WBC 9.7  HGB 11.5*  HCT 34.4*  PLT 354  NA 135  K 4.6  CL 101  CO2 24  BUN 6  CREATININE 0.93  GLUCOSE 138*  CALCIUM 8.5*    Examination: Neurologically intact ABD soft Neurovascular intact Sensation intact distally Intact pulses distally Dorsiflexion/Plantar flexion intact Incision: dressing C/D/I No cellulitis present Compartment soft} XR AP&Lat of hip shows well placed\fixed THA  Assessment:   1 Day Post-Op Procedure(s) (LRB): TOTAL HIP ARTHROPLASTY (Left) ADDITIONAL DIAGNOSIS:  Expected Acute Blood Loss Anemia, Hx ETOH abuse 2015  Plan: PT/OT WBAT, THA  posterior precautions  DVT Prophylaxis: SCDx72 hrs, ASA 325 mg BID x 2 weeks  DISCHARGE PLAN: Home, may need SNF based on performance with PT  DISCHARGE NEEDS: HHPT, Walker and 3-in-1 comode seat

## 2014-10-01 NOTE — Evaluation (Addendum)
Physical Therapy Evaluation Patient Details Name: Dale Ross MRN: [id number] DOB: 08/14/1960 Today's Date: 10/01/2014   History of Present Illness  Pt is a 54 y/o M s/p L THA, posterior approach.  Pt has no significant medical history on file.  Clinical Impression  Pt is s/p L THA resulting in the deficits listed below (see PT Problem List).  Session limited by severe pain and nausea, pt ambulated 50 ft this session.  Pt has difficulty remembering hip precautions using teachback and is consistently only able to remember 2/3 precautions.  Per OT, pt's roommate is blind and pt normally provides assist to roommate (roommate w/ temporary replacement assist).  Pt does not feel comfortable w/ sister providing assist for clothing and bathing ADLs.  Pt will benefit from skilled PT to increase their independence and safety with mobility to allow discharge to the venue listed below.  Recommending d/c to SNF as pt consistently cannot recall hip precautions and is thus not safe to return home w/o assist for ADLs.    Follow Up Recommendations Supervision for mobility/OOB;SNF    Equipment Recommendations  Rolling walker with 5" wheels;3in1 (PT)    Recommendations for Other Services OT consult     Precautions / Restrictions Precautions Precautions: Posterior Hip;Fall Precaution Booklet Issued: Yes (comment) Precaution Comments: Reviwed 3/3 precautions using teachback and pt was only able to recall 2/3 precautions, despite reviewing precautions multiple times.   Restrictions Weight Bearing Restrictions: Yes LLE Weight Bearing: Weight bearing as tolerated      Mobility  Bed Mobility Overal bed mobility: Needs Assistance Bed Mobility: Supine to Sit     Supine to sit: Min assist     General bed mobility comments: Assist w/ managing BLEs to sitting EOB, min use of bed rails, and cues for sequencing  Transfers Overall transfer level: Needs assistance Equipment used: Rolling walker (2  wheeled) Transfers: Sit to/from Stand Sit to Stand: Min guard         General transfer comment: Cues for proper hand placement and to push from bed.  Cues to stand upright once standing EOB.  Ambulation/Gait Ambulation/Gait assistance: Min guard Ambulation Distance (Feet): 50 Feet Assistive device: Rolling walker (2 wheeled) Gait Pattern/deviations: Step-to pattern;Decreased stride length;Decreased stance time - left;Decreased weight shift to left;Antalgic;Trunk flexed   Gait velocity interpretation: Below normal speed for age/gender General Gait Details: Pt reluctant to place weight through LLE 2/2 severe pain, heavy WB through BUEs as a result.  Trunk flexed, step to gait.  Dec L hip F and E.  Stairs            Wheelchair Mobility    Modified Rankin (Stroke Patients Only)       Balance Overall balance assessment: Needs assistance Sitting-balance support: No upper extremity supported;Feet supported Sitting balance-Leahy Scale: Fair     Standing balance support: Bilateral upper extremity supported;During functional activity Standing balance-Leahy Scale: Fair                               Pertinent Vitals/Pain Pain Assessment: Faces Faces Pain Scale: Hurts little more Pain Location: L hip Pain Descriptors / Indicators: Sore;Aching Pain Intervention(s): Limited activity within patient's tolerance    Home Living Family/patient expects to be discharged to:: Private residence Living Arrangements: Non-relatives/Friends Available Help at Discharge: Friend(s);Family;Available PRN/intermittently (his friend is blind and cannot assist patient.) Type of Home: Apartment Home Access: Level entry     Home Layout: One  level Home Equipment: None Additional Comments: Pt is a poor historian and has difficulty answering questions consistently and has poor short term memory.    Prior Function Level of Independence: Independent         Comments: Patient  assisted his friend with bathing, dressing, and changing his Pull Up      Hand Dominance   Dominant Hand: Right    Extremity/Trunk Assessment   Upper Extremity Assessment: Overall WFL for tasks assessed           Lower Extremity Assessment: Defer to PT evaluation   LLE Deficits / Details: weakness and limited ROM as expected s/p L THA     Communication   Communication: No difficulties  Cognition Arousal/Alertness: Awake/alert Behavior During Therapy: WFL for tasks assessed/performed Overall Cognitive Status: No family/caregiver present to determine baseline cognitive functioning       Memory: Decreased recall of precautions;Decreased short-term memory              General Comments General comments (skin integrity, edema, etc.): Pt w/ productive vomit at end of session once sitting in recliner chair.  RN in room and will provide pt w/ medication as appropriate.  Pt reminded several times that he must call his nurse or nurse tech if he would like to get OOB or out of the chair, that he must have someone with him at all times when getting OOB or chair.  Pt verbalizes understanding.  Chair alarm on at end of session.    Exercises Total Joint Exercises Ankle Circles/Pumps: AROM;Both;10 reps;Supine Heel Slides: AAROM;Left;10 reps;Supine Hip ABduction/ADduction: AAROM;Left;10 reps;Supine      Assessment/Plan    PT Assessment Patient needs continued PT services  PT Diagnosis Difficulty walking;Abnormality of gait;Generalized weakness;Acute pain   PT Problem List Decreased strength;Decreased range of motion;Decreased activity tolerance;Decreased balance;Decreased mobility;Decreased coordination;Decreased cognition;Decreased knowledge of use of DME;Decreased safety awareness;Decreased knowledge of precautions;Decreased skin integrity;Pain  PT Treatment Interventions DME instruction;Gait training;Stair training;Functional mobility training;Therapeutic activities;Therapeutic  exercise;Balance training;Neuromuscular re-education;Cognitive remediation;Patient/family education;Modalities   PT Goals (Current goals can be found in the Care Plan section) Acute Rehab PT Goals Patient Stated Goal: for his pain to decrease PT Goal Formulation: With patient Time For Goal Achievement: 10/08/14 Potential to Achieve Goals: Good    Frequency 7X/week   Barriers to discharge Decreased caregiver support Intermittent assist    Co-evaluation               End of Session Equipment Utilized During Treatment: Gait belt Activity Tolerance: Patient limited by pain Patient left: in chair;with call bell/phone within reach;with chair alarm set Nurse Communication: Mobility status;Precautions;Weight bearing status;Patient requests pain meds         Time: 0912-0945 PT Time Calculation (min) (ACUTE ONLY): 33 min   Charges:   PT Evaluation $Initial PT Evaluation Tier I: 1 Procedure PT Treatments $Gait Training: 8-22 mins   PT G CodesMichail Jewels:       Lovenia Debruler Parr PT, DPT [phone] Pager: [phone] 10/01/2014, 10:18 AM

## 2014-10-02 LAB — CBC
HCT: 31.9 % — ABNORMAL LOW (ref 39.0–52.0)
Hemoglobin: 10.9 g/dL — ABNORMAL LOW (ref 13.0–17.0)
MCH: 33.5 pg (ref 26.0–34.0)
MCHC: 34.2 g/dL (ref 30.0–36.0)
MCV: 98.2 fL (ref 78.0–100.0)
PLATELETS: 374 10*3/uL (ref 150–400)
RBC: 3.25 MIL/uL — AB (ref 4.22–5.81)
RDW: 11.5 % (ref 11.5–15.5)
WBC: 13.6 10*3/uL — ABNORMAL HIGH (ref 4.0–10.5)

## 2014-10-02 NOTE — Progress Notes (Signed)
Physical Therapy Treatment Patient Details Name: Dale Ross MRN: [id number] DOB: 01/31/1961 Today's Date: 10/02/2014    History of Present Illness Pt is a 54 y/o M s/p L THA, posterior approach.  Pt has no significant medical history on file.    PT Comments    Pt is making progress toward goals and increasing functional independence. Able to progress ambulation this AM. Pt would benefit from continued PT to further increase functional independence and safety. Continue to recommend SNF for ongoing therapy due to pt not having assistance at home.  Follow Up Recommendations  Supervision for mobility/OOB;SNF     Equipment Recommendations  Rolling walker with 5" wheels;3in1 (PT)    Recommendations for Other Services       Precautions / Restrictions Precautions Precautions: Posterior Hip;Fall Precaution Comments: Pt able to recall 2/3 precautions. Required cues to recall hip rotation precaution. Restrictions Weight Bearing Restrictions: Yes LLE Weight Bearing: Weight bearing as tolerated    Mobility  Bed Mobility Overal bed mobility: Needs Assistance Bed Mobility: Supine to Sit     Supine to sit: Min assist;HOB elevated     General bed mobility comments: Min A to bring LLE to EOB.  Transfers Overall transfer level: Needs assistance Equipment used: Rolling walker (2 wheeled) Transfers: Sit to/from Stand Sit to Stand: Min guard         General transfer comment: Cues for hand placement and position of LLE.  Ambulation/Gait Ambulation/Gait assistance: Min guard Ambulation Distance (Feet): 200 Feet Assistive device: Rolling walker (2 wheeled) Gait Pattern/deviations: Step-through pattern;Decreased stance time - left;Antalgic   Gait velocity interpretation: Below normal speed for age/gender General Gait Details: Cues for upright posture/forward head, RW management, and bending knees. Able to progress to step-through pattern.   Stairs            Wheelchair  Mobility    Modified Rankin (Stroke Patients Only)       Balance                                    Cognition Arousal/Alertness: Awake/alert Behavior During Therapy: WFL for tasks assessed/performed Overall Cognitive Status: No family/caregiver present to determine baseline cognitive functioning       Memory: Decreased recall of precautions              Exercises Total Joint Exercises Quad Sets: AROM;Left;10 reps Heel Slides: AAROM;Left;10 reps Hip ABduction/ADduction: AAROM;Left;10 reps Long Arc Quad: AROM;Left;10 reps    General Comments        Pertinent Vitals/Pain Pain Assessment: 0-10 Pain Score: 2  Pain Location: L hip Pain Descriptors / Indicators: Sore Pain Intervention(s): Monitored during session;Repositioned    Home Living                      Prior Function            PT Goals (current goals can now be found in the care plan section) Progress towards PT goals: Progressing toward goals    Frequency  7X/week    PT Plan Current plan remains appropriate    Co-evaluation             End of Session Equipment Utilized During Treatment: Gait belt Activity Tolerance: Patient tolerated treatment well Patient left: in chair;with call bell/phone within reach     Time: 1032-1055 PT Time Calculation (min) (ACUTE ONLY): 23 min  Charges:  G CodesLeonard Schwartz:      Zeynab Klett, SPTA 10/02/2014, 11:05 AM

## 2014-10-02 NOTE — Progress Notes (Signed)
Report called to [facility] 

## 2014-10-02 NOTE — Clinical Social Work Note (Signed)
Clinical Social Work Assessment  Patient Details  Name: Dale Ross MRN: [id number] Date of Birth: 08/26/1960  Date of referral:  10/02/14               Reason for consult:  Facility Placement                Permission sought to share information with:  Oceanographeracility Contact Representative Permission granted to share information::  Yes, Verbal Permission Granted  Name::        Agency::   ([place]  Relationship::     Contact Information:     Housing/Transportation Living arrangements for the past 2 months:  Single Family Home Source of Information:    Patient Interpreter Needed:  None Criminal Activity/Legal Involvement Pertinent to Current Situation/Hospitalization:  No - Comment as needed Significant Relationships:  Siblings (sister Darlene) Lives with:    Do you feel safe going back to the place where you live?  No (needing assisance and home alone) Need for family participation in patient care:  No (Coment)  Care giving concerns:  Patient is alert and oriented x4.  No family at bedside during the course of this assessment.   Social Worker assessment / plan:  CSW assessed patient at bedside.  PT is recommending SNF/STR at time of discharge.  [facility] [place] [facility] [place], but does not have any familiarity with facilities.  List was provided and bed offers will be presented.  Patient aware of discharge this afternoon and agreeable.  Employment status:   (unknown) Insurance information:  [place], [employer] PT Recommendations:    Information / Referral to community resources:     Patient/Family's Response to care:  Patient is appreciative of CSW assistance and support.  Patient/Family's Understanding of and Emotional Response to Diagnosis, Current Treatment, and Prognosis:  Patient is realistic and optimistic regarding prognosis and STR.  Patient is hopeful to return to an independent level of living.  Emotional  Assessment Appearance:  Appears stated age Attitude/Demeanor/Rapport:   (appropriate) Affect (typically observed):  Accepting, Adaptable, Appropriate, Calm, Stable, Pleasant, Hopeful Orientation:  Oriented to Self, Oriented to Place, Oriented to  Time, Oriented to Situation Alcohol / Substance use:  Not Applicable Psych involvement (Current and /or in the community):  No (Comment)  Discharge Needs  Concerns to be addressed:  No discharge needs identified Readmission within the last 30 days:  No Current discharge risk:  None Barriers to Discharge:  No Barriers Identified  Vickii PennaGina Mussa [place]es[place], LCSW [phone]  Psychiatric & Orthopedics (5N 1-8) Clinical Social Worker

## 2014-10-02 NOTE — Clinical Social Work Placement (Signed)
   CLINICAL SOCIAL WORK PLACEMENT  NOTE  Date:  10/02/2014  Patient Details  Name: Dale Ross MRN: [id number] Date of Birth: 11/27/1960  Clinical Social Work is seeking post-discharge placement for this patient at the Skilled  Nursing Facility level of care (*CSW will initial, date and re-position this form in  chart as items are completed):  Yes   Patient/family provided with [facility] Clinical Social Work Department's list of facilities offering this level of care within the geographic area requested by the patient (or if unable, by the patient's family).  Yes   Patient/family informed of their freedom to choose among providers that offer the needed level of care, that participate in Medicare, Medicaid or managed care program needed by the patient, have an available bed and are willing to accept the patient.  Yes   Patient/family informed of [facility]'s ownership interest in [facility] and [facility], as well as of the fact that they are under no obligation to receive care at these facilities.  PASRR submitted to EDS on 10/02/14     PASRR number received on 10/02/14     Existing PASRR number confirmed on       FL2 transmitted to all facilities in geographic area requested by pt/family on 10/02/14     FL2 transmitted to all facilities within larger geographic area on       Patient informed that his/her managed care company has contracts with or will negotiate with certain facilities, including the following:   (list provided)     Yes   Patient/family informed of bed offers received.  [facility]     Physician recommends and patient chooses bed at  (none)    [facility] on 10/02/14.  Patient to be transferred to facility by [facility]     Patient family notified on 10/02/14 of transfer.  Name of family member notified:  patient alert and oriented and states will contact family as  need arises     PHYSICIAN Please sign FL2, Please prepare priority discharge summary, including medications     Additional Comment:  Vickii PennaGina Aislin Onofre, LCSW [phone]  Psychiatric & Orthopedics (5N 1-8) Clinical Social Worker    _______________________________________________

## 2014-10-02 NOTE — Care Management Note (Signed)
Case Management Note  Patient Details  Name: Vianne BullsHarold J Mabe MRN: [id number] Date of Birth: 02/07/1961  Subjective/Objective:   Patient was admitted with Avascular necrosis of left hip, underwent a left total hip arthroplasty.                 Action/Plan: [facility] Social worker notified.  Expected Discharge Date:  10/02/14               Expected Discharge Plan:  Skilled Nursing Facility  In-House Referral:  Clinical Social Work  Discharge planning Services  CM Consult, [place]  Post Acute Care Choice:    Choice offered to:  NA  DME Arranged:    DME Agency:     HH Arranged:    HH Agency:     Status of Service:  Completed, signed off  Medicare Important Message Given:    Date Medicare IM Given:    Medicare IM give by:    Date Additional Medicare IM Given:    Additional Medicare Important Message give by:     If discussed at Long Length of Stay Meetings, dates discussed:    Additional Comments:  Durenda GuthrieBrady, Maytte Jacot Naomi, RN 10/02/2014, 8:57 AM

## 2014-10-02 NOTE — Progress Notes (Signed)
PATIENT ID: Dale Ross  MRN: [id number]  DOB/AGE:  01/19/1961 / 54 y.o.  2 Days Post-Op Procedure(s) (LRB): TOTAL HIP ARTHROPLASTY (Left)    PROGRESS NOTE Subjective: Patient is alert, oriented, no Nausea, no Vomiting, yes passing gas, no Bowel Movement. Taking PO well with Pt up eating in bed. Denies SOB, Chest or Calf Pain. Using Incentive Spirometer, PAS in place. Ambulate WBAT with pt walking 50 ft with therapy. Patient reports pain as mild  .    Objective: Vital signs in last 24 hours: Filed Vitals:   10/01/14 0129 10/01/14 0534 10/01/14 1345 10/02/14 0516  BP: 126/78 117/76 134/79 127/86  Pulse: 98 99 97 106  Temp: 98.3 F (36.8 C) 98.5 F (36.9 C) 98 F (36.7 C) 98.1 F (36.7 C)  TempSrc: Oral  Oral Oral  Resp:  16 18 16   Weight:      SpO2: 100% 100% 99% 100%      Intake/Output from previous day: I/O last 3 completed shifts: In: 3960 [P.O.:960; I.V.:3000] Out: 3900 [Urine:3900]   Intake/Output this shift:     LABORATORY DATA:  Recent Labs  10/01/14 0417 10/02/14 0445  WBC 9.7 13.6*  HGB 11.5* 10.9*  HCT 34.4* 31.9*  PLT 354 374  NA 135  --   K 4.6  --   CL 101  --   CO2 24  --   BUN 6  --   CREATININE 0.93  --   GLUCOSE 138*  --   CALCIUM 8.5*  --     Examination: Neurologically intact Neurovascular intact Sensation intact distally Intact pulses distally Dorsiflexion/Plantar flexion intact Incision: dressing C/D/I No cellulitis present Compartment soft} XR AP&Lat of hip shows well placed\fixed THA  Assessment:   2 Days Post-Op Procedure(s) (LRB): TOTAL HIP ARTHROPLASTY (Left) ADDITIONAL DIAGNOSIS:  Expected Acute Blood Loss Anemia, Hx ETOH abuse 2015  Plan: PT/OT WBAT, THA  posterior precautions  DVT Prophylaxis: SCDx72 hrs, ASA 325 mg BID x 2 weeks  DISCHARGE PLAN: Skilled Nursing Facility/Rehab  DISCHARGE NEEDS: HHPT, HHRN, Walker and 3-in-1 comode seat

## 2014-10-02 NOTE — Discharge Planning (Signed)
Patient will discharge today per MD order. Patient will discharge to [facility]- private room RN to call report prior to transportation to: [phone] Transportation: [facility]- scheduled for 1:30pm  CSW sent discharge summary to SNF for review.  Packet is complete.  RN and patient aware of discharge plans.  Vickii PennaGina Suda Forbess, LCSWA [phone]  Psychiatric & Orthopedics (5N 1-16) Clinical Social Worker

## 2014-10-02 NOTE — Discharge Summary (Signed)
Patient ID: Dale Ross MRN: [id number] DOB/AGE: 56/08/1960 56 y.o.  Admit date: 09/30/2014 Discharge date: 10/02/2014  Admission Diagnoses:  Principal Problem:   Avascular necrosis of bone of left hip Active Problems:   Arthritis, hip   Discharge Diagnoses:  Same  Past Medical History  Diagnosis Date  . Seasonal allergies   . Arthritis     Surgeries: Procedure(s): TOTAL HIP ARTHROPLASTY on 09/30/2014   Consultants:    Discharged Condition: Improved  Hospital Course: Dale BullsHarold J Ross is an 54 y.o. male who was admitted 09/30/2014 for operative treatment ofAvascular necrosis of bone of left hip. Patient has severe unremitting pain that affects sleep, daily activities, and work/hobbies. After pre-op clearance the patient was taken to the operating room on 09/30/2014 and underwent  Procedure(s): TOTAL HIP ARTHROPLASTY.    Patient was given perioperative antibiotics: Anti-infectives    Start     Dose/Rate Route Frequency Ordered Stop   09/30/14 0600  ceFAZolin (ANCEF) IVPB 2 g/50 mL premix     2 g 100 mL/hr over 30 Minutes Intravenous On call to O.R. 09/29/14 1222 09/30/14 1323       Patient was given sequential compression devices, early ambulation, and chemoprophylaxis to prevent DVT.  Patient benefited maximally from hospital stay and there were no complications.    Recent vital signs: Patient Vitals for the past 24 hrs:  BP Temp Temp src Pulse Resp SpO2  10/02/14 0516 127/86 mmHg 98.1 F (36.7 C) Oral (!) 106 16 100 %  10/01/14 1345 134/79 mmHg 98 F (36.7 C) Oral 97 18 99 %     Recent laboratory studies:  Recent Labs  10/01/14 0417 10/02/14 0445  WBC 9.7 13.6*  HGB 11.5* 10.9*  HCT 34.4* 31.9*  PLT 354 374  NA 135  --   K 4.6  --   CL 101  --   CO2 24  --   BUN 6  --   CREATININE 0.93  --   GLUCOSE 138*  --   CALCIUM 8.5*  --      Discharge Medications:     Medication List    STOP taking these medications        nabumetone 750 MG tablet   Commonly known as:  RELAFEN      TAKE these medications        aspirin EC 325 MG tablet  Take 1 tablet (325 mg total) by mouth 2 (two) times daily.     cyclobenzaprine 10 MG tablet  Commonly known as:  FLEXERIL  Take 1 tablet (10 mg total) by mouth 3 (three) times daily as needed for muscle spasms.     oxyCODONE-acetaminophen 5-325 MG per tablet  Commonly known as:  ROXICET  Take 1 tablet by mouth every 4 (four) hours as needed.        Diagnostic Studies: Dg Chest 2 View  09/18/2014   CLINICAL DATA:  Preop evaluation prior to hip replacement.  EXAM: CHEST  2 VIEW  COMPARISON:  Chest radiograph 01/24/2009  FINDINGS: The heart size and mediastinal contours are within normal limits. Both lungs are clear. The visualized skeletal structures are unremarkable.  IMPRESSION: No acute cardiopulmonary process.   Electronically Signed   By: Annia Beltrew  Davis M.D.   On: 09/18/2014 17:52   Dg Pelvis Portable  09/30/2014   CLINICAL DATA:  Post LEFT total hip arthroplasty  EXAM: PORTABLE PELVIS 1-2 VIEWS  COMPARISON:  Portable exam 1523 hours compared to 07/07/2009  FINDINGS: Interval placement of  LEFT hip prosthesis since previous exam.  No acute fracture or dislocation.  Osseous mineralization grossly normal.  Visualized pelvis intact.  IMPRESSION: LEFT hip prosthesis without acute complication.   Electronically Signed   By: Ulyses SouthwardMark  Boles M.D.   On: 09/30/2014 15:35    Disposition: Final discharge disposition not confirmed      Discharge Instructions    Call MD / Call 911    Complete by:  As directed   If you experience chest pain or shortness of breath, CALL 911 and be transported to the hospital emergency room.  If you develope a fever above 101 F, pus (white drainage) or increased drainage or redness at the wound, or calf pain, call your surgeon's office.     Change dressing    Complete by:  As directed   You may change your dressing on day 5, then change the dressing daily with sterile 4 x 4 inch  gauze dressing and paper tape.  You may clean the incision with alcohol prior to redressing     Constipation Prevention    Complete by:  As directed   Drink plenty of fluids.  Prune juice may be helpful.  You may use a stool softener, such as Colace (over the counter) 100 mg twice a day.  Use MiraLax (over the counter) for constipation as needed.     Diet - low sodium heart healthy    Complete by:  As directed      Discharge instructions    Complete by:  As directed   Follow up in office with Dr. Turner Danielsowan in 2 weeks.     Driving restrictions    Complete by:  As directed   No driving for 2 weeks     Follow the hip precautions as taught in Physical Therapy    Complete by:  As directed      Increase activity slowly as tolerated    Complete by:  As directed      Patient may shower    Complete by:  As directed   You may shower without a dressing once there is no drainage.  Do not wash over the wound.  If drainage remains, cover wound with plastic wrap and then shower.           Follow-up Information    Follow up with Nestor LewandowskyOWAN,FRANK J, MD In 2 weeks.   Specialty:  Orthopedic Surgery   Contact information:   [address] [place] [address] [phone]        Signed: Vear ClockHILLIPS, Rochanda Harpham R 10/02/2014, 7:50 AM

## 2014-10-05 ENCOUNTER — Encounter: Payer: Self-pay | Admitting: Internal Medicine

## 2014-10-05 ENCOUNTER — Non-Acute Institutional Stay (SKILLED_NURSING_FACILITY): Payer: Medicare Other | Admitting: Internal Medicine

## 2014-10-05 DIAGNOSIS — M87052 Idiopathic aseptic necrosis of left femur: Secondary | ICD-10-CM

## 2014-10-05 DIAGNOSIS — D62 Acute posthemorrhagic anemia: Secondary | ICD-10-CM

## 2014-10-05 DIAGNOSIS — J302 Other seasonal allergic rhinitis: Secondary | ICD-10-CM | POA: Diagnosis not present

## 2014-10-05 NOTE — Progress Notes (Signed)
MRN: [id number] Name: Dale Ross  Sex: male Age: 54 y.o. DOB: 10/27/1960  [facility] Facility/Room:219 Level Of Care: SNF Provider: Merrilee SeashoreALEXANDER, Ciclaly Mulcahey D Emergency Contacts: Extended Emergency Contact Information Primary Emergency Contact: Abdelrahman,Darlene Address: [address]          [place], [place] [address] [place] Home Phone: [phone] Relation: Sister Secondary Emergency Contact: Shirlean Schleinheeseboro,Marie          [place], [place] [id number] [place] Home Phone: [phone] Relation: Friend  Code Status: FULL  Allergies: Review of patient's allergies indicates no known allergies.  Chief Complaint  Patient presents with  . New Admit To SNF    HPI: Patient is 54 y.o. male who is admitted to SNF for OT/PT after L hip arthroplasty for avascular necrosis.  Past Medical History  Diagnosis Date  . Seasonal allergies   . Arthritis     Past Surgical History  Procedure Laterality Date  . No past surgeries    . Total hip arthroplasty Left 09/30/2014    Procedure: TOTAL HIP ARTHROPLASTY;  Surgeon: Gean BirchwoodFrank Rowan, MD;  Location: [facility] OR;  Service: Orthopedics;  Laterality: Left;      Medication List       This list is accurate as of: 10/05/14 11:59 PM.  Always use your most recent med list.               aspirin EC 325 MG tablet  Take 1 tablet (325 mg total) by mouth 2 (two) times daily.     cyclobenzaprine 10 MG tablet  Commonly known as:  FLEXERIL  Take 1 tablet (10 mg total) by mouth 3 (three) times daily as needed for muscle spasms.     oxyCODONE-acetaminophen 5-325 MG per tablet  Commonly known as:  ROXICET  Take 1 tablet by mouth every 4 (four) hours as needed.        No orders of the defined types were placed in this encounter.     There is no immunization history on file for this patient.  History  Substance Use Topics  . Smoking status: Current Every Day Smoker -- 1.00 packs/day for 15 years  . Smokeless tobacco: Not  on file  . Alcohol Use: 2.4 oz/week    4 Cans of beer per week    Family history is noncontributory    Review of Systems  DATA OBTAINED: from patient GENERAL:  no fevers, fatigue, appetite changes SKIN: No itching, rash or wounds EYES: No eye pain, redness, discharge EARS: No earache, tinnitus, change in hearing NOSE: No congestion, drainage or bleeding  MOUTH/THROAT: No mouth or tooth pain, No sore throat RESPIRATORY: No cough, wheezing, SOB CARDIAC: No chest pain, palpitations, lower extremity edema  GI: No abdominal pain, No N/V/D or constipation, No heartburn or reflux  GU: No dysuria, frequency or urgency, or incontinence  MUSCULOSKELETAL: No unrelieved bone/joint pain NEUROLOGIC: No headache, dizziness or focal weakness PSYCHIATRIC: No overt anxiety or sadness, No behavior issue.   Filed Vitals:   10/05/14 1520  BP: 128/80  Pulse: 82  Temp: 98 F (36.7 C)  Resp: 19    Physical Exam  GENERAL APPEARANCE: Alert, conversant,  No acute distress.  SKIN: incision without heat or redness HEAD: Normocephalic, atraumatic  EYES: Conjunctiva/lids clear. Pupils round, reactive. EOMs intact.  EARS: External exam WNL, canals clear. Hearing grossly normal.  NOSE: No deformity or discharge.  MOUTH/THROAT: Lips w/o lesions  RESPIRATORY: Breathing is even, unlabored. Lung sounds  are clear   CARDIOVASCULAR: Heart RRR no murmurs, rubs or gallops. No peripheral edema.   GASTROINTESTINAL: Abdomen is soft, non-tender, not distended w/ normal bowel sounds. GENITOURINARY: Bladder non tender, not distended  MUSCULOSKELETAL: No abnormal joints or musculature NEUROLOGIC:  Cranial nerves 2-12 grossly intact. Moves all extremities  PSYCHIATRIC: Mood and affect appropriate to situation, no behavioral issues  Patient Active Problem List   Diagnosis Date Noted  . Avascular necrosis of bone of left hip 09/30/2014  . Arthritis, hip 09/30/2014    CBC    Component Value Date/Time   WBC  13.6* 10/02/2014 0445   RBC 3.25* 10/02/2014 0445   HGB 10.9* 10/02/2014 0445   HCT 31.9* 10/02/2014 0445   PLT 374 10/02/2014 0445   MCV 98.2 10/02/2014 0445   LYMPHSABS 1.9 09/18/2014 1536   MONOABS 0.6 09/18/2014 1536   EOSABS 0.1 09/18/2014 1536   BASOSABS 0.0 09/18/2014 1536    CMP     Component Value Date/Time   NA 135 10/01/2014 0417   K 4.6 10/01/2014 0417   CL 101 10/01/2014 0417   CO2 24 10/01/2014 0417   GLUCOSE 138* 10/01/2014 0417   BUN 6 10/01/2014 0417   CREATININE 0.93 10/01/2014 0417   CALCIUM 8.5* 10/01/2014 0417   GFRNONAA >60 10/01/2014 0417   GFRAA >60 10/01/2014 0417    Assessment and Plan  Avascular necrosis of bone of left hip S/p L hip arthroplasty 5/4;admitted to SNF for OT/PT;flexeril and oxycodone for spasm and pain; ASA 325 BID as prophylaxis     Margit HanksALEXANDER, Naleigha Raimondi D, MD

## 2014-10-11 ENCOUNTER — Encounter: Payer: Self-pay | Admitting: Internal Medicine

## 2014-10-11 DIAGNOSIS — J302 Other seasonal allergic rhinitis: Secondary | ICD-10-CM | POA: Insufficient documentation

## 2014-10-11 DIAGNOSIS — D62 Acute posthemorrhagic anemia: Secondary | ICD-10-CM | POA: Insufficient documentation

## 2014-10-11 NOTE — Assessment & Plan Note (Addendum)
S/p L hip arthroplasty 5/4;admitted to SNF for OT/PT;flexeril and oxycodone for spasm and pain; ASA 325 BID as prophylaxis

## 2014-10-11 NOTE — Assessment & Plan Note (Signed)
No transfusion required;Hb stable at 10.9

## 2014-10-11 NOTE — Assessment & Plan Note (Signed)
Not active at this time 

## 2014-10-15 ENCOUNTER — Non-Acute Institutional Stay (SKILLED_NURSING_FACILITY): Payer: Medicare Other | Admitting: Internal Medicine

## 2014-10-15 ENCOUNTER — Encounter: Payer: Self-pay | Admitting: Internal Medicine

## 2014-10-15 DIAGNOSIS — Z9660 Presence of unspecified orthopedic joint implant: Secondary | ICD-10-CM

## 2014-10-15 DIAGNOSIS — Z96649 Presence of unspecified artificial hip joint: Secondary | ICD-10-CM | POA: Insufficient documentation

## 2014-10-15 DIAGNOSIS — M87052 Idiopathic aseptic necrosis of left femur: Secondary | ICD-10-CM | POA: Diagnosis not present

## 2014-10-15 DIAGNOSIS — J302 Other seasonal allergic rhinitis: Secondary | ICD-10-CM

## 2014-10-15 DIAGNOSIS — D62 Acute posthemorrhagic anemia: Secondary | ICD-10-CM

## 2014-10-15 NOTE — Progress Notes (Signed)
MRN: [id number] Name: Dale BullsHarold J Freiman  Sex: male Age: 54 y.o. DOB: 09/19/1960  [facility]: 219 Level Of Care: SNF Provider: Merrilee SeashoreALEXANDER, Talis Iwan D Emergency Contacts: Extended Emergency Contact Information Primary Emergency Contact: Tomich,Darlene Address: [address]          [place], [place] [address] [place] Home Phone: [phone] Relation: Sister Secondary Emergency Contact: Shirlean Schleinheeseboro,Marie          [place], [place] [id number] [place] Home Phone: [phone] Relation: Friend  Code Status: FULL  Allergies: Review of patient's allergies indicates no known allergies.  Chief Complaint  Patient presents with  . Discharge Note    HPI: Patient is 54 y.o. male who had L hip arthroplasty 2/2 aseptic necrosis admitted to SNF for OT/PT.  Past Medical History  Diagnosis Date  . Seasonal allergies   . Arthritis     Past Surgical History  Procedure Laterality Date  . No past surgeries    . Total hip arthroplasty Left 09/30/2014    Procedure: TOTAL HIP ARTHROPLASTY;  Surgeon: Gean BirchwoodFrank Rowan, MD;  Location: [facility] OR;  Service: Orthopedics;  Laterality: Left;      Medication List       This list is accurate as of: 10/15/14  4:45 PM.  Always use your most recent med list.               cyclobenzaprine 10 MG tablet  Commonly known as:  FLEXERIL  Take 1 tablet (10 mg total) by mouth 3 (three) times daily as needed for muscle spasms.     ondansetron 4 MG tablet  Commonly known as:  ZOFRAN  Take 4 mg by mouth every 6 (six) hours as needed for nausea or vomiting.     oxyCODONE-acetaminophen 5-325 MG per tablet  Commonly known as:  ROXICET  Take 1 tablet by mouth every 4 (four) hours as needed.        Meds ordered this encounter  Medications  . ondansetron (ZOFRAN) 4 MG tablet    Sig: Take 4 mg by mouth every 6 (six) hours as needed for nausea or vomiting.     There is no immunization history on file for this  patient.  History  Substance Use Topics  . Smoking status: Current Every Day Smoker -- 1.00 packs/day for 15 years  . Smokeless tobacco: Not on file  . Alcohol Use: 2.4 oz/week    4 Cans of beer per week    Filed Vitals:   10/15/14 1634  BP: 125/78  Pulse: 79  Temp: 98 F (36.7 C)  Resp: 22    Physical Exam  GENERAL APPEARANCE: Alert, conversant. No acute distress.  HEENT: Unremarkable. RESPIRATORY: Breathing is even, unlabored. Lung sounds are clear   CARDIOVASCULAR: Heart RRR no murmurs, rubs or gallops. No peripheral edema.  GASTROINTESTINAL: Abdomen is soft, non-tender, not distended w/ normal bowel sounds.  NEUROLOGIC: Cranial nerves 2-12 grossly intact. Moves all extremities  Patient Active Problem List   Diagnosis Date Noted  . S/P total hip arthroplasty 10/15/2014  . Acute blood loss anemia 10/11/2014  . Seasonal allergies   . Avascular necrosis of bone of left hip 09/30/2014  . Arthritis, hip 09/30/2014    CBC    Component Value Date/Time   WBC 13.6* 10/02/2014 0445   RBC 3.25* 10/02/2014 0445   HGB 10.9* 10/02/2014 0445   HCT 31.9* 10/02/2014 0445   PLT 374 10/02/2014 0445   MCV 98.2 10/02/2014 0445  LYMPHSABS 1.9 09/18/2014 1536   MONOABS 0.6 09/18/2014 1536   EOSABS 0.1 09/18/2014 1536   BASOSABS 0.0 09/18/2014 1536    CMP     Component Value Date/Time   NA 135 10/01/2014 0417   K 4.6 10/01/2014 0417   CL 101 10/01/2014 0417   CO2 24 10/01/2014 0417   GLUCOSE 138* 10/01/2014 0417   BUN 6 10/01/2014 0417   CREATININE 0.93 10/01/2014 0417   CALCIUM 8.5* 10/01/2014 0417   GFRNONAA >60 10/01/2014 0417   GFRAA >60 10/01/2014 0417    Assessment and Plan  Pt is stable for d/c to home with HH/OT/PT. DME- rolling walker, BSC, shower chair/bench.  Margit HanksALEXANDER, Brenna Friesenhahn D, MD

## 2014-10-20 DIAGNOSIS — R269 Unspecified abnormalities of gait and mobility: Secondary | ICD-10-CM | POA: Diagnosis not present

## 2014-10-20 DIAGNOSIS — M6281 Muscle weakness (generalized): Secondary | ICD-10-CM | POA: Diagnosis not present

## 2014-10-20 DIAGNOSIS — Z471 Aftercare following joint replacement surgery: Secondary | ICD-10-CM | POA: Diagnosis not present

## 2014-10-20 DIAGNOSIS — Z96642 Presence of left artificial hip joint: Secondary | ICD-10-CM | POA: Diagnosis not present

## 2015-08-23 ENCOUNTER — Ambulatory Visit
Admission: RE | Admit: 2015-08-23 | Discharge: 2015-08-23 | Disposition: A | Discharge status: Home / Self Care | Payer: No Typology Code available for payment source | Source: Ambulatory Visit | Attending: Internal Medicine | Admitting: Internal Medicine

## 2015-08-23 ENCOUNTER — Other Ambulatory Visit: Payer: Self-pay | Admitting: Internal Medicine

## 2015-08-23 DIAGNOSIS — R05 Cough: Secondary | ICD-10-CM

## 2015-08-23 DIAGNOSIS — R059 Cough, unspecified: Secondary | ICD-10-CM

## 2015-09-01 ENCOUNTER — Other Ambulatory Visit: Payer: Self-pay | Admitting: Internal Medicine

## 2015-09-01 ENCOUNTER — Ambulatory Visit
Admission: RE | Admit: 2015-09-01 | Discharge: 2015-09-01 | Disposition: A | Discharge status: Home / Self Care | Payer: No Typology Code available for payment source | Source: Ambulatory Visit | Attending: Internal Medicine | Admitting: Internal Medicine

## 2015-09-01 DIAGNOSIS — M25551 Pain in right hip: Secondary | ICD-10-CM

## 2015-11-16 ENCOUNTER — Other Ambulatory Visit: Payer: Self-pay | Admitting: Orthopedic Surgery

## 2015-12-16 NOTE — Pre-Procedure Instructions (Signed)
    Vianne BullsHarold J Whetstone  12/16/2015      [id number] [address]- [place], [place] - [phone] AT [facility] [address] [place] [address] Phone: [phone] Fax: [phone]    Your procedure is scheduled on 12/27/15.  [facility].M.  Call this number if you have problems the morning of surgery:  [phone]   Remember:  Do not eat food or drink liquids after midnight.  Take these medicines the morning of surgery with A SIP OF WATER --OXYCODONE,FLEXERIL   Do not wear jewelry, make-up or nail polish.  Do not wear lotions, powders, or perfumes.  You may wear deoderant.  Do not shave 48 hours prior to surgery.  Men may shave face and neck.  Do not bring valuables to the hospital.  [facility] is not [facility] for any belongings or valuables.  Contacts, dentures or bridgework may not be worn into surgery.  Leave your suitcase in the car.  After surgery it may be brought to your room.  For patients admitted to the hospital, discharge time will be determined by your treatment team.  Patients discharged the day of surgery will not be allowed to drive home.   Name and phone number of your driver:    Special instructions:  Do not take any aspirin,anti-inflammatories,vitamins,or herbal supplements 5-7 days prior to surgery.  Please read over the following fact sheets that you were given. MRSA Information

## 2015-12-17 ENCOUNTER — Encounter (HOSPITAL_COMMUNITY)
Admission: RE | Admit: 2015-12-17 | Discharge: 2015-12-17 | Disposition: A | Discharge status: Home / Self Care | Payer: Medicare (Managed Care) | Source: Ambulatory Visit | Attending: Orthopedic Surgery | Admitting: Orthopedic Surgery

## 2015-12-17 ENCOUNTER — Encounter (HOSPITAL_COMMUNITY): Payer: Self-pay

## 2015-12-17 DIAGNOSIS — M879 Osteonecrosis, unspecified: Secondary | ICD-10-CM | POA: Diagnosis not present

## 2015-12-17 DIAGNOSIS — Z0183 Encounter for blood typing: Secondary | ICD-10-CM | POA: Insufficient documentation

## 2015-12-17 DIAGNOSIS — Z01812 Encounter for preprocedural laboratory examination: Secondary | ICD-10-CM | POA: Diagnosis not present

## 2015-12-17 DIAGNOSIS — Z01818 Encounter for other preprocedural examination: Secondary | ICD-10-CM | POA: Diagnosis not present

## 2015-12-17 HISTORY — DX: Dental caries, unspecified: K02.9

## 2015-12-17 HISTORY — DX: Other symptoms and signs involving cognitive functions and awareness: R41.89

## 2015-12-17 HISTORY — DX: Anxiety disorder, unspecified: F41.9

## 2015-12-17 LAB — URINE MICROSCOPIC-ADD ON: RBC / HPF: NONE SEEN RBC/hpf (ref 0–5)

## 2015-12-17 LAB — SURGICAL PCR SCREEN
MRSA, PCR: NEGATIVE
STAPHYLOCOCCUS AUREUS: NEGATIVE

## 2015-12-17 LAB — CBC WITH DIFFERENTIAL/PLATELET
BASOS PCT: 1 %
Basophils Absolute: 0 10*3/uL (ref 0.0–0.1)
Eosinophils Absolute: 0.2 10*3/uL (ref 0.0–0.7)
Eosinophils Relative: 3 %
HEMATOCRIT: 40.2 % (ref 39.0–52.0)
HEMOGLOBIN: 13.2 g/dL (ref 13.0–17.0)
LYMPHS ABS: 2.1 10*3/uL (ref 0.7–4.0)
LYMPHS PCT: 35 %
MCH: 33.8 pg (ref 26.0–34.0)
MCHC: 32.8 g/dL (ref 30.0–36.0)
MCV: 103.1 fL — AB (ref 78.0–100.0)
MONO ABS: 0.5 10*3/uL (ref 0.1–1.0)
MONOS PCT: 9 %
NEUTROS ABS: 3.1 10*3/uL (ref 1.7–7.7)
NEUTROS PCT: 52 %
Platelets: 361 10*3/uL (ref 150–400)
RBC: 3.9 MIL/uL — ABNORMAL LOW (ref 4.22–5.81)
RDW: 11.7 % (ref 11.5–15.5)
WBC: 6 10*3/uL (ref 4.0–10.5)

## 2015-12-17 LAB — BASIC METABOLIC PANEL
ANION GAP: 6 (ref 5–15)
BUN: 16 mg/dL (ref 6–20)
CALCIUM: 9 mg/dL (ref 8.9–10.3)
CHLORIDE: 100 mmol/L — AB (ref 101–111)
CO2: 28 mmol/L (ref 22–32)
Creatinine, Ser: 0.98 mg/dL (ref 0.61–1.24)
GFR calc non Af Amer: >60 mL/min (ref >60)
GLUCOSE: 88 mg/dL (ref 65–99)
POTASSIUM: 4.6 mmol/L (ref 3.5–5.1)
Sodium: 134 mmol/L — ABNORMAL LOW (ref 135–145)

## 2015-12-17 LAB — PROTIME-INR
INR: 1.04 (ref 0.00–1.49)
Prothrombin Time: 13.8 seconds (ref 11.6–15.2)

## 2015-12-17 LAB — APTT: aPTT: 28 seconds (ref 24–37)

## 2015-12-17 LAB — URINALYSIS, ROUTINE W REFLEX MICROSCOPIC
BILIRUBIN URINE: NEGATIVE
Glucose, UA: NEGATIVE mg/dL
Hgb urine dipstick: NEGATIVE
Ketones, ur: NEGATIVE mg/dL
Nitrite: NEGATIVE
Protein, ur: NEGATIVE mg/dL
Specific Gravity, Urine: 1.019 (ref 1.005–1.030)
pH: 6.5 (ref 5.0–8.0)

## 2015-12-17 LAB — TYPE AND SCREEN
ABO/RH(D): O POS
Antibody Screen: NEGATIVE

## 2015-12-24 NOTE — H&P (Signed)
TOTAL HIP ADMISSION H&P  Patient is admitted for right total hip arthroplasty.  Subjective:  Chief Complaint: right hip pain  HPI: Dale HINCHLIFFE, 55 y.o. male, has a history of pain and functional disability in the right hip(s) due to AVN and patient has failed non-surgical conservative treatments for greater than 12 weeks to include NSAID's and/or analgesics, use of assistive devices and activity modification.  Onset of symptoms was gradual starting several years ago with gradually worsening course since that time.The patient noted no past surgery on the right hip(s).  Patient currently rates pain in the right hip at 10 out of 10 with activity. Patient has night pain, worsening of pain with activity and weight bearing, trendelenberg gait, pain that interfers with activities of daily living and pain with passive range of motion. Patient has evidence of joint space narrowing and AVN by imaging studies. This condition presents safety issues increasing the risk of falls.  There is no current active infection.  Patient Active Problem List   Diagnosis Date Noted  . S/P total hip arthroplasty 10/15/2014  . Acute blood loss anemia 10/11/2014  . Seasonal allergies   . Avascular necrosis of bone of left hip (HCC) 09/30/2014  . Arthritis, hip 09/30/2014   Past Medical History:  Diagnosis Date  . Anxiety   . Arthritis   . Cognitive deficits    learning disabilities  . Dental caries   . Seasonal allergies     Past Surgical History:  Procedure Laterality Date  . NO PAST SURGERIES    . TOTAL HIP ARTHROPLASTY Left 09/30/2014   Procedure: TOTAL HIP ARTHROPLASTY;  Surgeon: Gean Birchwood, MD;  Location: [facility] OR;  Service: Orthopedics;  Laterality: Left;    No prescriptions prior to admission.   Allergies  Allergen Reactions  . No Known Allergies     Social History  Substance Use Topics  . Smoking status: Current Every Day Smoker    Packs/day: 1.00    Years: 15.00  . Smokeless tobacco: Not on  file  . Alcohol use 2.4 oz/week    4 Cans of beer per week     Comment: daily    No family history on file.   Review of Systems  Constitutional: Negative.   HENT:       Sinus problems  Eyes:       Poor vision  Respiratory: Negative.   Cardiovascular: Negative.   Gastrointestinal: Negative.   Musculoskeletal: Positive for joint pain and myalgias.  Skin: Negative.   Neurological: Negative.   Endo/Heme/Allergies: Negative.   Psychiatric/Behavioral: Negative.     Objective:  Physical Exam  Constitutional: He is oriented to person, place, and time. He appears well-developed and well-nourished.  HENT:  Head: Normocephalic and atraumatic.  Eyes: Pupils are equal, round, and reactive to light.  Neck: Normal range of motion. Neck supple.  Cardiovascular: Intact distal pulses.   Respiratory: Effort normal.  Musculoskeletal:  the patient has good strength and good range of motion in the left hip.  Patient's right hip also has good strength.  He does have obvious irritability with internal rotation but otherwise has good range of motion.  Pain with heel bump.  He does walk with an antalgic gait.  His calves are soft and nontender.  He is neurovascularly intact distally.  Neurological: He is alert and oriented to person, place, and time.  Skin: Skin is warm and dry.  Psychiatric: He has a normal mood and affect. His behavior is normal. Judgment and  thought content normal.    Vital signs in last 24 hours:    Labs:   Estimated body mass index is 20.75 kg/m as calculated from the following:   Height as of 12/17/15: 5' 3.5" (1.613 m).   Weight as of 12/17/15: 54 kg (119 lb).   Imaging Review Plain radiographs demonstrate AP of the pelvis and crosstable lateral of the right and left hip are taken and reviewed in office today.  Patient's left total hip arthroplasty appears be well-placed and well fixed.  Patient's right hip does have avascular necrosis of a large portion of the  femoral head.  Assessment/Plan:  End stage arthritis, right hip(s)  The patient history, physical examination, clinical judgement of the provider and imaging studies are consistent with end stage degenerative joint disease of the right hip(s) and total hip arthroplasty is deemed medically necessary. The treatment options including medical management, injection therapy, arthroscopy and arthroplasty were discussed at length. The risks and benefits of total hip arthroplasty were presented and reviewed. The risks due to aseptic loosening, infection, stiffness, dislocation/subluxation,  thromboembolic complications and other imponderables were discussed.  The patient acknowledged the explanation, agreed to proceed with the plan and consent was signed. Patient is being admitted for inpatient treatment for surgery, pain control, PT, OT, prophylactic antibiotics, VTE prophylaxis, progressive ambulation and ADL's and discharge planning.The patient is planning to be discharged home with home health services

## 2015-12-26 DIAGNOSIS — M87051 Idiopathic aseptic necrosis of right femur: Secondary | ICD-10-CM | POA: Diagnosis present

## 2015-12-26 MED ORDER — CEFAZOLIN SODIUM-DEXTROSE 2-4 GM/100ML-% IV SOLN
2.0000 g | INTRAVENOUS | Status: AC
  Administered 2015-12-27: 2 g via INTRAVENOUS
  Filled 2015-12-26: qty 100

## 2015-12-26 MED ORDER — CHLORHEXIDINE GLUCONATE 4 % EX LIQD: 60.0000 mL | Freq: Once | CUTANEOUS | Status: DC

## 2015-12-27 ENCOUNTER — Inpatient Hospital Stay (HOSPITAL_COMMUNITY): Payer: Medicare (Managed Care) | Admitting: Anesthesiology

## 2015-12-27 ENCOUNTER — Encounter (HOSPITAL_COMMUNITY)
Admission: RE | Disposition: A | Discharge status: Skilled Nursing Facility | Payer: Self-pay | Source: Ambulatory Visit | Attending: Orthopedic Surgery

## 2015-12-27 ENCOUNTER — Inpatient Hospital Stay (HOSPITAL_COMMUNITY)
Admission: RE | Admit: 2015-12-27 | Discharge: 2015-12-29 | DRG: 470 | Disposition: A | Discharge status: Skilled Nursing Facility | Payer: Medicare (Managed Care) | Source: Ambulatory Visit | Attending: Orthopedic Surgery | Admitting: Orthopedic Surgery

## 2015-12-27 ENCOUNTER — Encounter (HOSPITAL_COMMUNITY): Payer: Self-pay | Admitting: Surgery

## 2015-12-27 ENCOUNTER — Inpatient Hospital Stay (HOSPITAL_COMMUNITY): Payer: Medicare (Managed Care)

## 2015-12-27 DIAGNOSIS — Z96642 Presence of left artificial hip joint: Secondary | ICD-10-CM | POA: Diagnosis present

## 2015-12-27 DIAGNOSIS — D62 Acute posthemorrhagic anemia: Secondary | ICD-10-CM | POA: Diagnosis not present

## 2015-12-27 DIAGNOSIS — M87051 Idiopathic aseptic necrosis of right femur: Secondary | ICD-10-CM | POA: Diagnosis present

## 2015-12-27 DIAGNOSIS — M879 Osteonecrosis, unspecified: Secondary | ICD-10-CM | POA: Diagnosis present

## 2015-12-27 DIAGNOSIS — F419 Anxiety disorder, unspecified: Secondary | ICD-10-CM | POA: Diagnosis present

## 2015-12-27 DIAGNOSIS — Z419 Encounter for procedure for purposes other than remedying health state, unspecified: Secondary | ICD-10-CM

## 2015-12-27 DIAGNOSIS — F819 Developmental disorder of scholastic skills, unspecified: Secondary | ICD-10-CM | POA: Diagnosis present

## 2015-12-27 DIAGNOSIS — F17200 Nicotine dependence, unspecified, uncomplicated: Secondary | ICD-10-CM | POA: Diagnosis present

## 2015-12-27 DIAGNOSIS — M1611 Unilateral primary osteoarthritis, right hip: Principal | ICD-10-CM | POA: Diagnosis present

## 2015-12-27 DIAGNOSIS — M25551 Pain in right hip: Secondary | ICD-10-CM | POA: Diagnosis present

## 2015-12-27 HISTORY — PX: TOTAL HIP ARTHROPLASTY: SHX124

## 2015-12-27 SURGERY — ARTHROPLASTY, HIP, TOTAL, ANTERIOR APPROACH
Anesthesia: Spinal | Site: Hip | Laterality: Right

## 2015-12-27 MED ORDER — ONDANSETRON HCL 4 MG/2ML IJ SOLN
4.0000 mg | Freq: Four times a day (QID) | INTRAMUSCULAR | Status: DC | PRN
  Administered 2015-12-27 – 2015-12-28 (×2): 4 mg via INTRAVENOUS
  Filled 2015-12-27 (×2): qty 2

## 2015-12-27 MED ORDER — PHENOL 1.4 % MT LIQD: 1.0000 | OROMUCOSAL | Status: DC | PRN

## 2015-12-27 MED ORDER — DEXTROSE-NACL 5-0.45 % IV SOLN
INTRAVENOUS | Status: DC
Start: 2015-12-27 — End: 2015-12-27

## 2015-12-27 MED ORDER — FENTANYL CITRATE (PF) 250 MCG/5ML IJ SOLN
INTRAMUSCULAR | Status: DC | PRN
  Administered 2015-12-27: 25 ug via INTRAVENOUS

## 2015-12-27 MED ORDER — ALUM & MAG HYDROXIDE-SIMETH 200-200-20 MG/5ML PO SUSP: 30.0000 mL | ORAL | Status: DC | PRN

## 2015-12-27 MED ORDER — ASPIRIN EC 325 MG PO TBEC: 325.0000 mg | DELAYED_RELEASE_TABLET | Freq: Two times a day (BID) | ORAL | 0 refills | Status: DC

## 2015-12-27 MED ORDER — DEXAMETHASONE SODIUM PHOSPHATE 10 MG/ML IJ SOLN
10.0000 mg | Freq: Once | INTRAMUSCULAR | Status: AC
  Administered 2015-12-28: 10 mg via INTRAVENOUS
  Filled 2015-12-27: qty 1

## 2015-12-27 MED ORDER — CHLORHEXIDINE GLUCONATE 4 % EX LIQD: 60.0000 mL | Freq: Once | CUTANEOUS | Status: DC

## 2015-12-27 MED ORDER — TRANEXAMIC ACID 1000 MG/10ML IV SOLN
2000.0000 mg | Freq: Once | INTRAVENOUS | Status: DC
  Filled 2015-12-27: qty 20

## 2015-12-27 MED ORDER — GLYCOPYRROLATE 0.2 MG/ML IJ SOLN
INTRAMUSCULAR | Status: DC | PRN
  Administered 2015-12-27: 0.2 mg via INTRAVENOUS

## 2015-12-27 MED ORDER — LORATADINE 10 MG PO TABS
10.0000 mg | ORAL_TABLET | Freq: Every day | ORAL | Status: DC
  Administered 2015-12-28 – 2015-12-29 (×2): 10 mg via ORAL
  Filled 2015-12-27 (×2): qty 1

## 2015-12-27 MED ORDER — OXYCODONE HCL 5 MG PO TABS
5.0000 mg | ORAL_TABLET | ORAL | Status: DC | PRN
  Administered 2015-12-27 – 2015-12-28 (×3): 10 mg via ORAL
  Administered 2015-12-29: 5 mg via ORAL
  Filled 2015-12-27 (×4): qty 2

## 2015-12-27 MED ORDER — FENTANYL CITRATE (PF) 250 MCG/5ML IJ SOLN
INTRAMUSCULAR | Status: AC
  Filled 2015-12-27: qty 5

## 2015-12-27 MED ORDER — TIZANIDINE HCL 2 MG PO TABS: 2.0000 mg | ORAL_TABLET | Freq: Four times a day (QID) | ORAL | 0 refills | Status: DC | PRN

## 2015-12-27 MED ORDER — BUPIVACAINE LIPOSOME 1.3 % IJ SUSP
20.0000 mL | Freq: Once | INTRAMUSCULAR | Status: DC
  Filled 2015-12-27: qty 20

## 2015-12-27 MED ORDER — BUPIVACAINE-EPINEPHRINE 0.25% -1:200000 IJ SOLN
INTRAMUSCULAR | Status: DC | PRN
  Administered 2015-12-27: 50 mL

## 2015-12-27 MED ORDER — METOCLOPRAMIDE HCL 5 MG PO TABS: 5.0000 mg | ORAL_TABLET | Freq: Three times a day (TID) | ORAL | Status: DC | PRN

## 2015-12-27 MED ORDER — DIPHENHYDRAMINE HCL 12.5 MG/5ML PO ELIX: 12.5000 mg | ORAL_SOLUTION | ORAL | Status: DC | PRN

## 2015-12-27 MED ORDER — HYDROMORPHONE HCL 1 MG/ML IJ SOLN
1.0000 mg | INTRAMUSCULAR | Status: DC | PRN
  Administered 2015-12-27 – 2015-12-28 (×2): 1 mg via INTRAVENOUS
  Filled 2015-12-27 (×2): qty 1

## 2015-12-27 MED ORDER — OXYCODONE-ACETAMINOPHEN 5-325 MG PO TABS: 1.0000 | ORAL_TABLET | ORAL | 0 refills | Status: DC | PRN

## 2015-12-27 MED ORDER — PHENYLEPHRINE HCL 10 MG/ML IJ SOLN
INTRAMUSCULAR | Status: DC | PRN
  Administered 2015-12-27 (×2): 40 ug via INTRAVENOUS
  Administered 2015-12-27 (×2): 20 ug via INTRAVENOUS

## 2015-12-27 MED ORDER — ONDANSETRON HCL 4 MG PO TABS: 4.0000 mg | ORAL_TABLET | Freq: Four times a day (QID) | ORAL | Status: DC | PRN

## 2015-12-27 MED ORDER — PROPOFOL 500 MG/50ML IV EMUL
INTRAVENOUS | Status: DC | PRN
  Administered 2015-12-27: 40 ug/kg/min via INTRAVENOUS

## 2015-12-27 MED ORDER — FLEET ENEMA 7-19 GM/118ML RE ENEM: 1.0000 | ENEMA | Freq: Once | RECTAL | Status: DC | PRN

## 2015-12-27 MED ORDER — MENTHOL 3 MG MT LOZG: 1.0000 | LOZENGE | OROMUCOSAL | Status: DC | PRN

## 2015-12-27 MED ORDER — BISACODYL 5 MG PO TBEC: 5.0000 mg | DELAYED_RELEASE_TABLET | Freq: Every day | ORAL | Status: DC | PRN

## 2015-12-27 MED ORDER — 0.9 % SODIUM CHLORIDE (POUR BTL) OPTIME
TOPICAL | Status: DC | PRN
  Administered 2015-12-27: 1000 mL

## 2015-12-27 MED ORDER — BUPIVACAINE LIPOSOME 1.3 % IJ SUSP
INTRAMUSCULAR | Status: DC | PRN
  Administered 2015-12-27: 20 mL

## 2015-12-27 MED ORDER — MIDAZOLAM HCL 2 MG/2ML IJ SOLN
INTRAMUSCULAR | Status: AC
  Filled 2015-12-27: qty 2

## 2015-12-27 MED ORDER — METOCLOPRAMIDE HCL 5 MG/ML IJ SOLN: 5.0000 mg | Freq: Three times a day (TID) | INTRAMUSCULAR | Status: DC | PRN

## 2015-12-27 MED ORDER — METHOCARBAMOL 500 MG PO TABS
500.0000 mg | ORAL_TABLET | Freq: Four times a day (QID) | ORAL | Status: DC | PRN
  Administered 2015-12-27 – 2015-12-28 (×2): 500 mg via ORAL
  Filled 2015-12-27 (×2): qty 1

## 2015-12-27 MED ORDER — METHOCARBAMOL 1000 MG/10ML IJ SOLN
500.0000 mg | Freq: Four times a day (QID) | INTRAMUSCULAR | Status: DC | PRN
  Filled 2015-12-27: qty 5

## 2015-12-27 MED ORDER — DOCUSATE SODIUM 100 MG PO CAPS
100.0000 mg | ORAL_CAPSULE | Freq: Two times a day (BID) | ORAL | Status: DC
  Administered 2015-12-27 – 2015-12-29 (×4): 100 mg via ORAL
  Filled 2015-12-27 (×4): qty 1

## 2015-12-27 MED ORDER — ASPIRIN EC 325 MG PO TBEC
325.0000 mg | DELAYED_RELEASE_TABLET | Freq: Every day | ORAL | Status: DC
  Administered 2015-12-28 – 2015-12-29 (×2): 325 mg via ORAL
  Filled 2015-12-27 (×2): qty 1

## 2015-12-27 MED ORDER — LACTATED RINGERS IV SOLN
INTRAVENOUS | Status: DC
  Administered 2015-12-27 (×2): via INTRAVENOUS

## 2015-12-27 MED ORDER — KCL IN DEXTROSE-NACL 20-5-0.45 MEQ/L-%-% IV SOLN
INTRAVENOUS | Status: DC
  Administered 2015-12-27: 18:00:00 via INTRAVENOUS
  Filled 2015-12-27: qty 1000

## 2015-12-27 MED ORDER — PROPOFOL 10 MG/ML IV BOLUS
INTRAVENOUS | Status: AC
  Filled 2015-12-27: qty 20

## 2015-12-27 MED ORDER — SENNOSIDES-DOCUSATE SODIUM 8.6-50 MG PO TABS: 1.0000 | ORAL_TABLET | Freq: Every evening | ORAL | Status: DC | PRN

## 2015-12-27 MED ORDER — TRANEXAMIC ACID 1000 MG/10ML IV SOLN
INTRAVENOUS | Status: DC | PRN
  Administered 2015-12-27: 2000 mg via INTRAVENOUS

## 2015-12-27 MED ORDER — PHENYLEPHRINE HCL 10 MG/ML IJ SOLN
INTRAMUSCULAR | Status: DC | PRN
  Administered 2015-12-27: 5 ug/min via INTRAVENOUS

## 2015-12-27 MED ORDER — ACETAMINOPHEN 650 MG RE SUPP: 650.0000 mg | Freq: Four times a day (QID) | RECTAL | Status: DC | PRN

## 2015-12-27 MED ORDER — BUPIVACAINE-EPINEPHRINE (PF) 0.25% -1:200000 IJ SOLN
INTRAMUSCULAR | Status: AC
  Filled 2015-12-27: qty 60

## 2015-12-27 MED ORDER — TRANEXAMIC ACID 1000 MG/10ML IV SOLN
1000.0000 mg | INTRAVENOUS | Status: AC
  Administered 2015-12-27: 1000 mg via INTRAVENOUS
  Filled 2015-12-27: qty 10

## 2015-12-27 MED ORDER — MIDAZOLAM HCL 5 MG/5ML IJ SOLN
INTRAMUSCULAR | Status: DC | PRN
  Administered 2015-12-27 (×2): 1 mg via INTRAVENOUS

## 2015-12-27 MED ORDER — BUPIVACAINE HCL (PF) 0.5 % IJ SOLN
INTRAMUSCULAR | Status: DC | PRN
  Administered 2015-12-27: 12.5 mg via INTRATHECAL

## 2015-12-27 MED ORDER — ACETAMINOPHEN 325 MG PO TABS: 650.0000 mg | ORAL_TABLET | Freq: Four times a day (QID) | ORAL | Status: DC | PRN

## 2015-12-27 SURGICAL SUPPLY — 44 items
BLADE SURG ROTATE 9660 (MISCELLANEOUS) IMPLANT
CAPT HIP TOTAL 2 ×3 IMPLANT
COVER PERINEAL POST (MISCELLANEOUS) ×3 IMPLANT
COVER SURGICAL LIGHT HANDLE (MISCELLANEOUS) ×3 IMPLANT
DRAPE C-ARM 42X72 X-RAY (DRAPES) ×3 IMPLANT
DRAPE STERI IOBAN 125X83 (DRAPES) ×3 IMPLANT
DRAPE U-SHAPE 47X51 STRL (DRAPES) ×6 IMPLANT
DRSG AQUACEL AG ADV 3.5X10 (GAUZE/BANDAGES/DRESSINGS) ×3 IMPLANT
DURAPREP 26ML APPLICATOR (WOUND CARE) ×3 IMPLANT
ELECT BLADE 4.0 EZ CLEAN MEGAD (MISCELLANEOUS) ×6
ELECT REM PT RETURN 9FT ADLT (ELECTROSURGICAL) ×3
ELECTRODE BLDE 4.0 EZ CLN MEGD (MISCELLANEOUS) ×2 IMPLANT
ELECTRODE REM PT RTRN 9FT ADLT (ELECTROSURGICAL) ×1 IMPLANT
FACESHIELD WRAPAROUND (MASK) ×6 IMPLANT
GLOVE BIO SURGEON STRL SZ7.5 (GLOVE) ×3 IMPLANT
GLOVE BIO SURGEON STRL SZ8.5 (GLOVE) ×6 IMPLANT
GLOVE BIOGEL PI IND STRL 8 (GLOVE) ×2 IMPLANT
GLOVE BIOGEL PI IND STRL 9 (GLOVE) ×1 IMPLANT
GLOVE BIOGEL PI INDICATOR 8 (GLOVE) ×4
GLOVE BIOGEL PI INDICATOR 9 (GLOVE) ×2
GOWN STRL REUS W/ TWL LRG LVL3 (GOWN DISPOSABLE) ×1 IMPLANT
GOWN STRL REUS W/ TWL XL LVL3 (GOWN DISPOSABLE) ×2 IMPLANT
GOWN STRL REUS W/TWL LRG LVL3 (GOWN DISPOSABLE) ×2
GOWN STRL REUS W/TWL XL LVL3 (GOWN DISPOSABLE) ×4
KIT BASIN OR (CUSTOM PROCEDURE TRAY) ×3 IMPLANT
KIT ROOM TURNOVER OR (KITS) ×3 IMPLANT
MANIFOLD NEPTUNE II (INSTRUMENTS) ×3 IMPLANT
NS IRRIG 1000ML POUR BTL (IV SOLUTION) ×3 IMPLANT
PACK TOTAL JOINT (CUSTOM PROCEDURE TRAY) ×3 IMPLANT
PAD ARMBOARD 7.5X6 YLW CONV (MISCELLANEOUS) ×6 IMPLANT
SAW OSC TIP CART 19.5X105X1.3 (SAW) ×3 IMPLANT
SUT ETHIBOND NAB CT1 #1 30IN (SUTURE) ×6 IMPLANT
SUT VIC AB 0 CTX 36 (SUTURE) ×2
SUT VIC AB 0 CTX36XBRD ANTBCTR (SUTURE) ×1 IMPLANT
SUT VIC AB 1 CTX 36 (SUTURE) ×2
SUT VIC AB 1 CTX36XBRD ANBCTR (SUTURE) ×1 IMPLANT
SUT VIC AB 2-0 CT1 27 (SUTURE) ×4
SUT VIC AB 2-0 CT1 TAPERPNT 27 (SUTURE) ×2 IMPLANT
SUT VIC AB 3-0 PS2 18 (SUTURE) ×2
SUT VIC AB 3-0 PS2 18XBRD (SUTURE) ×1 IMPLANT
TOWEL OR 17X24 6PK STRL BLUE (TOWEL DISPOSABLE) ×3 IMPLANT
TOWEL OR 17X26 10 PK STRL BLUE (TOWEL DISPOSABLE) ×3 IMPLANT
TRAY FOLEY CATH 14FR (SET/KITS/TRAYS/PACK) IMPLANT
WATER STERILE IRR 1000ML POUR (IV SOLUTION) IMPLANT

## 2015-12-27 NOTE — Op Note (Signed)
OPERATIVE REPORT    DATE OF PROCEDURE:  12/27/2015       PREOPERATIVE DIAGNOSIS:  RIGHT HIP AVASCULAR NECROSIS                                                          POSTOPERATIVE DIAGNOSIS:  RIGHT HIP AVASCULAR NECROSIS                                                           PROCEDURE: Anterior R total hip arthroplasty using a 48 mm DePuy Pinnacle  Cup, [employer], 0-degree polyethylene liner, a +1.5 32 mm ceramic head, a 6 Depuy Triloc stem   SURGEON: Alika Eppes J    ASSISTANT:   Eric K. [employer]  (present throughout entire procedure and necessary for timely completion of the procedure)   ANESTHESIA: Spinal BLOOD LOSS: 300 FLUID REPLACEMENT: 1500 crystalloid Antibiotic: 2gm ancef Tranexamic Acid: 1gm iv, 2gm topical COMPLICATIONS: none    INDICATIONS FOR PROCEDURE: A 55 y.o. year-old With  RIGHT HIP AVASCULAR NECROSIS   for 5 years, x-rays show bone-on-bone arthritic changes, and osteophytes. Despite conservative measures with observation, anti-inflammatory medicine, narcotics, use of a cane, has severe unremitting pain and can ambulate only a few blocks before resting. Patient desires elective R total hip arthroplasty to decrease pain and increase function. The risks, benefits, and alternatives were discussed at length including but not limited to the risks of infection, bleeding, nerve injury, stiffness, blood clots, the need for revision surgery, cardiopulmonary complications, among others, and they were willing to proceed. Questions answered     PROCEDURE IN DETAIL: The patient was identified by armband,  received preoperative IV antibiotics in the holding area at [facility], taken to the operating room , appropriate anesthetic monitors  were attached and  anesthesia was induced with the patienton the gurney. The HANA boots were applied to the feet and he was then transferred to the HANA table with a peroneal post and support underneath the  non-operative le, which was locked in 5 lb traction. Theoperative lower extremity was then prepped and draped in the usual sterile fashion from just above the iliac crest to the knee. And a timeout procedure was performed. We then made a 12 cm incision along the interval at the leading edge of the tensor fascia lata of starting at 2 cm lateral to and 2 cm distal to the ASIS. Small bleeders in the skin and subcutaneous tissue identified and cauterized we dissected down to the fascia and made an incision in the fascia allowing [place] to elevate the fascia of the tensor muscle and exploited the interval between the rectus and the tensor fascia lata. A Hohmann retractor was then placed along the superior neck of the femur and a Cobra retractor along the inferior neck of the femur we teed the capsule starting out at the superior anterior aspect of the acetabulum going distally and made the T along the neck both leaflets of the T were tagged with #2 Ethibond suture. Cobra retractors were then placed along the inferior and superior neck allowing [place] to perform a standard neck  cut and removed the femoral head with a power corkscrew. We then placed a right angle Hohmann retractor along the anterior aspect of the acetabulum a spiked Cobra in the cotyloid notch and posteriorly a Muelller retractor. We then sequentially reamed up to a 48 mm basket reamer obtaining good coverage in all quadrants, verified by C-arm imaging. Under C-arm control with and hammered into place a 54 mm Pinnacle cup in 45 of abduction and 15 of anteversion. The cup seated nicely and required no supplemental screws. We then placed a central hole Eliminator and a 0 polyethylene liner. The foot was then externally rotated to 100, the HANA elevator was placed around the flare of the greater trochanter and the limb was extended and abducted delivering the proximal femur up into the wound. A medium Hohmann retractor was placed over the greater trochanter and a  Mueller retractor along the posterior femoral neck completing the exposure. We then performed releases superiorly and and inferiorly of the capsule going back to the pirformis fossa superiorly and to the lesser trochanter inferiorly. We then entered the proximal femur with the box cutting offset chisel followed by, a canal sounder, the chili pepper and broaching up to a 6 broach. This seated nicely and we reamed the calcar. A trial reduction was performed with a 1.5 mm 32 mm head.The limb lengths were excellent the hip was stable in 90 of external rotation. At this point the trial components removed and we hammered into place a # 6 Tri-Lock stem with G[employer] coating. This was a hi offset stem and a + 32 mm ceramic ball was then hammered into place the hip was reduced and final C-arm images obtained. The wound was thoroughly irrigated with normal saline solution. We repaired the ant capsule and the tensor fascia lot a with running 0 vicryl suture. the subcutaneous tissue was closed with 2-0 and 3-0 Vicryl suture followed by an Aquacil dressing. At this point the patient was awaken and transferred to hospital gurney without difficulty. The subcutaneous tissue with 0 and 2-0 undyed Vicryl suture and the skin with running  3-0 vicryl subcuticular suture. Aquacil dressing was applied. The patient was then unclamped, rolled supine, awaken extubated and taken to recovery room without difficulty in stable condition.   Gean Birchwood J 12/27/2015, 12:20 PM

## 2015-12-27 NOTE — Transfer of Care (Signed)
Immediate Anesthesia Transfer of Care Note  Patient: Dale Ross  Procedure(s) Performed: Procedure(s): RIGHT TOTAL HIP ARTHROPLASTY ANTERIOR APPROACH (Right)  Patient Location: PACU  Anesthesia Type:MAC and Spinal  Level of Consciousness: awake, alert , oriented and patient cooperative  Airway & Oxygen Therapy: Patient Spontanous Breathing and Patient connected to nasal cannula oxygen  Post-op Assessment: Report given to RN and Post -op Vital signs reviewed and stable  Post vital signs: Reviewed and stable  Last Vitals:  Vitals:   12/27/15 0845  BP: 129/76  Pulse: 62  Resp: 20  Temp: 36.4 C    Last Pain:  Vitals:   12/27/15 0905  TempSrc:   PainSc: 5          Complications: No apparent anesthesia complications

## 2015-12-27 NOTE — Interval H&P Note (Signed)
History and Physical Interval Note:  12/27/2015 9:15 AM  Dale Ross  has presented today for surgery, with the diagnosis of RIGHT HIP AVASCULAR NECROSIS  The various methods of treatment have been discussed with the patient and family. After consideration of risks, benefits and other options for treatment, the patient has consented to  Procedure(s): RIGHT TOTAL HIP ARTHROPLASTY ANTERIOR APPROACH (Right) as a surgical intervention .  The patient's history has been reviewed, patient examined, no change in status, stable for surgery.  I have reviewed the patient's chart and labs.  Questions were answered to the patient's satisfaction.     Nestor Lewandowsky

## 2015-12-27 NOTE — Discharge Instructions (Signed)

## 2015-12-27 NOTE — Interval H&P Note (Signed)
History and Physical Interval Note:  12/27/2015 9:16 AM  Dale Ross  has presented today for surgery, with the diagnosis of RIGHT HIP AVASCULAR NECROSIS  The various methods of treatment have been discussed with the patient and family. After consideration of risks, benefits and other options for treatment, the patient has consented to  Procedure(s): RIGHT TOTAL HIP ARTHROPLASTY ANTERIOR APPROACH (Right) as a surgical intervention .  The patient's history has been reviewed, patient examined, no change in status, stable for surgery.  I have reviewed the patient's chart and labs.  Questions were answered to the patient's satisfaction.     Nestor Lewandowsky

## 2015-12-27 NOTE — Progress Notes (Signed)
   12/27/15 1605  Clinical Encounter Type  Visited With Patient  Visit Type Initial  Chaplain on afternoon rounds visited with patient.

## 2015-12-27 NOTE — Anesthesia Preprocedure Evaluation (Signed)
Anesthesia Evaluation  Patient identified by MRN, date of birth, ID band Patient awake    Reviewed: Allergy & Precautions, NPO status , Patient's Chart, lab work & pertinent test results  Airway Mallampati: II  TM Distance: >3 FB Neck ROM: Full    Dental no notable dental hx.    Pulmonary Current Smoker,    Pulmonary exam normal breath sounds clear to auscultation       Cardiovascular negative cardio ROS Normal cardiovascular exam Rhythm:Regular Rate:Normal     Neuro/Psych negative neurological ROS  negative psych ROS   GI/Hepatic negative GI ROS, Neg liver ROS,   Endo/Other  negative endocrine ROS  Renal/GU negative Renal ROS  negative genitourinary   Musculoskeletal negative musculoskeletal ROS (+)   Abdominal   Peds negative pediatric ROS (+)  Hematology negative hematology ROS (+)   Anesthesia Other Findings   Reproductive/Obstetrics negative OB ROS                             Anesthesia Physical Anesthesia Plan  ASA: II  Anesthesia Plan: Spinal   Post-op Pain Management:    Induction: Intravenous  Airway Management Planned: Simple Face Mask  Additional Equipment:   Intra-op Plan:   Post-operative Plan:   Informed Consent: I have reviewed the patients History and Physical, chart, labs and discussed the procedure including the risks, benefits and alternatives for the proposed anesthesia with the patient or authorized representative who has indicated his/her understanding and acceptance.   Dental advisory given  Plan Discussed with: CRNA and Surgeon  Anesthesia Plan Comments:         Anesthesia Quick Evaluation

## 2015-12-28 ENCOUNTER — Encounter (HOSPITAL_COMMUNITY): Payer: Self-pay | Admitting: Orthopedic Surgery

## 2015-12-28 LAB — CBC
HEMATOCRIT: 35.4 % — AB (ref 39.0–52.0)
Hemoglobin: 11.5 g/dL — ABNORMAL LOW (ref 13.0–17.0)
MCH: 33.1 pg (ref 26.0–34.0)
MCHC: 32.5 g/dL (ref 30.0–36.0)
MCV: 102 fL — AB (ref 78.0–100.0)
Platelets: 311 10*3/uL (ref 150–400)
RBC: 3.47 MIL/uL — AB (ref 4.22–5.81)
RDW: 11.9 % (ref 11.5–15.5)
WBC: 7.8 10*3/uL (ref 4.0–10.5)

## 2015-12-28 NOTE — Progress Notes (Signed)
SW completed FL2 for patient. Patient has an existing  PASRR number.  PASRR number: [id number]  Crista Curb, Social Work 5N 6N 12/28/2015 11:57 AM

## 2015-12-28 NOTE — Progress Notes (Signed)
SW spoke with pt at bedside. Patient is alert and oriented. There was no family present.   Patient confirms that he would like to return to [facility] upon discharge. He states he has no questions for SW at this time.  Crista Curb, Social Work 5N 6N 12/28/2015 10:54 AM

## 2015-12-28 NOTE — Anesthesia Postprocedure Evaluation (Signed)
Anesthesia Post Note  Patient: BENNO SHANGRAW  Procedure(s) Performed: Procedure(s) (LRB): RIGHT TOTAL HIP ARTHROPLASTY ANTERIOR APPROACH (Right)  Patient location during evaluation: PACU Anesthesia Type: Spinal Level of consciousness: oriented and awake and alert Pain management: pain level controlled Vital Signs Assessment: post-procedure vital signs reviewed and stable Respiratory status: spontaneous breathing, respiratory function stable and patient connected to nasal cannula oxygen Cardiovascular status: blood pressure returned to baseline and stable Postop Assessment: no headache and no backache Anesthetic complications: no    Last Vitals:  Vitals:   12/27/15 2137 12/28/15 0411  BP: 107/74 108/76  Pulse: 89 96  Resp: 16 18  Temp: 37.4 C 36.7 C    Last Pain:  Vitals:   12/28/15 0807  TempSrc:   PainSc: 8                  Greidy Sherard S

## 2015-12-28 NOTE — Progress Notes (Signed)
Patient is nauseous and vomiting- medicated for nausea Surie Suchocki A Evelise Reine, RN

## 2015-12-28 NOTE — NC FL2 (Signed)
[place] MEDICAID FL2 LEVEL OF CARE SCREENING TOOL     IDENTIFICATION  Patient Name: Dale Ross Birthdate: [birth year] Sex: male Admission Date (Current Location): 12/27/2015  [facility] and [place] Number:  Producer, television/film/video and Address:  [facility], [address], [place], [place] [address]      Provider Number: [id number]  Attending Physician Name and Address:  Gean Birchwood, MD  Relative Name and Phone Number:       Current Level of Care: SNF Recommended Level of Care: Skilled Nursing Facility (Patient will be returning back to [facility] Prior Approval Number:    Date Approved/Denied:   PASRR Number:    Discharge Plan: SNF    Current Diagnoses: Patient Active Problem List   Diagnosis Date Noted  . Arthritis of right hip 12/27/2015  . Avascular necrosis of bone of right hip (HCC) 12/26/2015  . S/P total hip arthroplasty 10/15/2014  . Acute blood loss anemia 10/11/2014  . Seasonal allergies   . Avascular necrosis of bone of left hip (HCC) 09/30/2014  . Arthritis, hip 09/30/2014    Orientation RESPIRATION BLADDER Height & Weight     Self, Time, Situation, Place  Normal Continent Weight: 119 lb (54 kg) Height:     BEHAVIORAL SYMPTOMS/MOOD NEUROLOGICAL BOWEL NUTRITION STATUS     (None Noted.) Continent    AMBULATORY STATUS COMMUNICATION OF NEEDS Skin   Limited Assist Verbally Normal                       Personal Care Assistance Level of Assistance   (Paient is a moderate assist.)           Functional Limitations Info             SPECIAL CARE FACTORS FREQUENCY                       Contractures      Additional Factors Info                  Current Medications (12/28/2015):  This is the current hospital active medication list Current Facility-Administered Medications  Medication Dose Route Frequency Provider Last Rate Last Dose  . acetaminophen (TYLENOL) tablet 650 mg  650 mg Oral Q6H PRN Allena Katz, PA-C       Or  . acetaminophen (TYLENOL) suppository 650 mg  650 mg Rectal Q6H PRN Allena Katz, PA-C      . alum & mag hydroxide-simeth (MAALOX/MYLANTA) 200-200-20 MG/5ML suspension 30 mL  30 mL Oral Q4H PRN Allena Katz, PA-C      . aspirin EC tablet 325 mg  325 mg Oral Q breakfast Allena Katz, PA-C   325 mg at 12/28/15 1016  . bisacodyl (DULCOLAX) EC tablet 5 mg  5 mg Oral Daily PRN Allena Katz, PA-C      . dextrose 5 % and 0.45 % NaCl with KCl 20 mEq/L infusion   Intravenous Continuous Allena Katz, PA-C 125 mL/hr at 12/27/15 1803    . diphenhydrAMINE (BENADRYL) 12.5 MG/5ML elixir 12.5-25 mg  12.5-25 mg Oral Q4H PRN Allena Katz, PA-C      . docusate sodium (COLACE) capsule 100 mg  100 mg Oral BID Allena Katz, PA-C   100 mg at 12/28/15 1016  . HYDROmorphone (DILAUDID) injection 1 mg  1 mg Intravenous Q2H PRN Allena Katz, PA-C   1 mg at  12/28/15 0807  . loratadine (CLARITIN) tablet 10 mg  10 mg Oral Daily Allena Katz, PA-C   10 mg at 12/28/15 1016  . menthol-cetylpyridinium (CEPACOL) lozenge 3 mg  1 lozenge Oral PRN Allena Katz, PA-C       Or  . phenol (CHLORASEPTIC) mouth spray 1 spray  1 spray Mouth/Throat PRN Allena Katz, PA-C      . methocarbamol (ROBAXIN) tablet 500 mg  500 mg Oral Q6H PRN Allena Katz, PA-C   500 mg at 12/28/15 2409   Or  . methocarbamol (ROBAXIN) 500 mg in dextrose 5 % 50 mL IVPB  500 mg Intravenous Q6H PRN Allena Katz, PA-C      . metoCLOPramide (REGLAN) tablet 5-10 mg  5-10 mg Oral Q8H PRN Allena Katz, PA-C       Or  . metoCLOPramide (REGLAN) injection 5-10 mg  5-10 mg Intravenous Q8H PRN Allena Katz, PA-C      . ondansetron [facility]) tablet 4 mg  4 mg Oral Q6H PRN Allena Katz, PA-C       Or  . ondansetron [facility]) injection 4 mg  4 mg Intravenous Q6H PRN Allena Katz, PA-C   4 mg at 12/27/15 1724  . oxyCODONE (Oxy IR/ROXICODONE) immediate release tablet 5-10 mg  5-10 mg Oral Q3H PRN Allena Katz, PA-C   10 mg at 12/27/15 2118  . senna-docusate (Senokot-S) tablet 1 tablet  1 tablet Oral QHS PRN Allena Katz, PA-C      . sodium phosphate (FLEET) 7-19 GM/118ML enema 1 enema  1 enema Rectal Once PRN Allena Katz, PA-C         Discharge Medications: Please see discharge summary for a list of discharge medications.  Relevant Imaging Results:  Relevant Lab Results:   Additional Information    Brantleigh Mifflin R, LCSW

## 2015-12-28 NOTE — Plan of Care (Signed)
Problem: Safety: Goal: Ability to remain free from injury will improve Outcome: Progressing Safety precautions and fall preventions maintained, no fall or injury noted this shift  Problem: Pain Managment: Goal: General experience of comfort will improve Outcome: Progressing Pain well controlled  Problem: Physical Regulation: Goal: Will remain free from infection Outcome: Progressing No S/S of infection noted, VS WNL  Problem: Skin Integrity: Goal: Risk for impaired skin integrity will decrease Outcome: Progressing No skin issues noted  Problem: Activity: Goal: Risk for activity intolerance will decrease Outcome: Progressing Remains in the bed this shift  Problem: Nutrition: Goal: Adequate nutrition will be maintained Outcome: Progressing Taking po well  Problem: Bowel/Gastric: Goal: Will not experience complications related to bowel motility Outcome: Progressing No bowel issues reported

## 2015-12-28 NOTE — Evaluation (Signed)
Occupational Therapy Evaluation Patient Details Name: Dale Ross MRN: [id number] DOB: May 14, 1961 Today's Date: 12/28/2015    History of Present Illness 55 y.o. 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 PMH: Lt THA, cognitive deficits, anxiety.    Clinical Impression   PTA Pt independent in ADL and ambulating with RW. Pt with decreased independence in ADL and safety. Pt also displaying confusion at times during session, but always willing to attempt therapeutic activities. Please see Pt OT problem list below. Pt benefits from verbal cues during ambulation for functional tasks like ADL. Pt will benefit from OT care in the acute setting. Next session to focus on reinforcing LB dressing and bathing.    Follow Up Recommendations  SNF    Equipment Recommendations  Other (comment) (To be determined at the next care venue)    Recommendations for Other Services       Precautions / Restrictions Precautions Precautions: Fall Restrictions Weight Bearing Restrictions: Yes RLE Weight Bearing: Weight bearing as tolerated      Mobility Bed Mobility Overal bed mobility: Needs Assistance Bed Mobility: Supine to Sit     Supine to sit: Min assist     General bed mobility comments: min assist with Rt LE and trunk to come to sitting. Pt using rails and HOB elevated.   Transfers Overall transfer level: Needs assistance Equipment used: Rolling walker (2 wheeled) Transfers: Sit to/from Stand Sit to Stand: Min guard         General transfer comment: additional time and cues needed     Balance Overall balance assessment: Needs assistance Sitting-balance support: No upper extremity supported Sitting balance-Leahy Scale: Good     Standing balance support: Bilateral upper extremity supported Standing balance-Leahy Scale: Poor Standing balance comment: using RW for balance and support                            ADL Overall ADL's : Needs  assistance/impaired Eating/Feeding: Set up;Sitting   Grooming: Wash/dry hands;Min guard;Standing Grooming Details (indicate cue type and reason): Pt able to wash hands after toileting standing at sink. OT inquired about oral care and Pt reports that he has been doing oral care in the hospital. Upper Body Bathing: Set up;Sitting       Upper Body Dressing : Set up;Sitting       Toilet Transfer: Minimal assistance;Ambulation;BSC;RW Toilet Transfer Details (indicate cue type and reason): verbal cues for safe hand placement Toileting- Clothing Manipulation and Hygiene: Maximal assistance;Sit to/from stand       Functional mobility during ADLs: Minimal assistance General ADL Comments: Pt limited due to pain.     Vision     Perception     Praxis      Pertinent Vitals/Pain Pain Assessment: Faces Faces Pain Scale: Hurts even more Pain Location: Rt Hip Pain Descriptors / Indicators: Grimacing;Guarding Pain Intervention(s): Limited activity within patient's tolerance;Monitored during session;Repositioned     Hand Dominance     Extremity/Trunk Assessment Upper Extremity Assessment Upper Extremity Assessment: Overall WFL for tasks assessed   Lower Extremity Assessment Lower Extremity Assessment: Defer to PT evaluation       Communication Communication Communication: No difficulties   Cognition Arousal/Alertness: Awake/alert Behavior During Therapy: WFL for tasks assessed/performed Overall Cognitive Status: History of cognitive impairments - at baseline                 General Comments: Pt with inconsistency in answering questions.  General Comments       Exercises       Shoulder Instructions      Home Living Family/patient expects to be discharged to:: Skilled nursing facility Living Arrangements: Other relatives Available Help at Discharge: Family                         Home Equipment: Dan Humphreys - 2 wheels   Additional Comments: nursing  reports that he is going to [facility] SNF      Prior Functioning/Environment Level of Independence: Independent with assistive device(s)        Comments: reports using a walker for ambulation    OT Diagnosis: Generalized weakness;Acute pain   OT Problem List: Decreased strength;Decreased range of motion;Decreased activity tolerance;Impaired balance (sitting and/or standing);Decreased safety awareness;Decreased knowledge of use of DME or AE;Pain   OT Treatment/Interventions: Self-care/ADL training;DME and/or AE instruction;Patient/family education;Therapeutic activities    OT Goals(Current goals can be found in the care plan section) Acute Rehab OT Goals Patient Stated Goal: eventually get home OT Goal Formulation: With patient Time For Goal Achievement: 01/04/16 Potential to Achieve Goals: Good ADL Goals Pt Will Perform Lower Body Bathing: with supervision;with adaptive equipment;sitting/lateral leans Pt Will Perform Lower Body Dressing: with supervision;with adaptive equipment;sit to/from stand Pt Will Transfer to Toilet: with supervision;ambulating;bedside commode Pt Will Perform Toileting - Clothing Manipulation and hygiene: with supervision;sit to/from stand  OT Frequency: Min 2X/week   Barriers to D/C:            Co-evaluation              End of Session Equipment Utilized During Treatment: Gait belt;Rolling walker Nurse Communication: Mobility status  Activity Tolerance: Patient tolerated treatment well Patient left: in chair;with call bell/phone within reach;with chair alarm set   Time: 6861-6837 OT Time Calculation (min): 22 min Charges:  OT General Charges $OT Visit: 1 Procedure OT Evaluation $OT Eval Low Complexity: 1 Procedure G-Codes:    Evern Bio Dalaina Tates OTR/L 12/28/2015, 2:29 PM [phone]

## 2015-12-28 NOTE — Evaluation (Signed)
Physical Therapy Evaluation Patient Details Name: Dale Ross MRN: [id number] DOB: 03-27-1961 Today's Date: 12/28/2015   History of Present Illness  55 y.o. 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 PMH: Lt THA, cognitive deficits, anxiety.   Clinical Impression  Pt is s/p direct anterior THA resulting in the deficits listed below (see PT Problem List).  Pt will benefit from skilled PT to increase their independence and safety with mobility to allow discharge to SNF for further rehabilitation following acute stay.       Follow Up Recommendations SNF;Supervision/Assistance - 24 hour    Equipment Recommendations  None recommended by PT    Recommendations for Other Services       Precautions / Restrictions Precautions Precautions: Fall Restrictions Weight Bearing Restrictions: Yes RLE Weight Bearing: Weight bearing as tolerated      Mobility  Bed Mobility Overal bed mobility: Needs Assistance Bed Mobility: Supine to Sit     Supine to sit: Min assist     General bed mobility comments: min assist with Rt LE and trunk to come to sitting. Pt using rails and HOB elevated.   Transfers Overall transfer level: Needs assistance Equipment used: Rolling walker (2 wheeled) Transfers: Sit to/from Stand Sit to Stand: Min guard         General transfer comment: additional time and cues needed   Ambulation/Gait Ambulation/Gait assistance: Min assist Ambulation Distance (Feet): 15 Feet Assistive device: Rolling walker (2 wheeled) Gait Pattern/deviations: Step-to pattern;Trunk flexed Gait velocity: slow pattern   General Gait Details: Cues needed for posture throughout session, min assist provided to Rt LE to advance with swing phase. Pt with tendency for keeping Rt foot plantar flexed and hip flexed.   Stairs            Wheelchair Mobility    Modified Rankin (Stroke Patients Only)       Balance Overall balance assessment: Needs  assistance Sitting-balance support: No upper extremity supported Sitting balance-Leahy Scale: Good     Standing balance support: Bilateral upper extremity supported Standing balance-Leahy Scale: Poor Standing balance comment: using rw                             Pertinent Vitals/Pain Pain Assessment: Faces (no pain at rest) Faces Pain Scale: Hurts little more Pain Location: Rt hip Pain Descriptors / Indicators: Grimacing;Guarding Pain Intervention(s): Limited activity within patient's tolerance;Monitored during session    Home Living Family/patient expects to be discharged to:: Skilled nursing facility Living Arrangements: Other relatives Available Help at Discharge: Family (sister)           Home Equipment: Dan Humphreys - 2 wheels Additional Comments: Pt initially reporting that he lives by himself and was planning to return to living by him self following surgery. Nursing reported that he was going to [facility] which the patient then confirmed as his plan.     Prior Function Level of Independence: Independent with assistive device(s)         Comments: reports using a walker for ambulation     Hand Dominance        Extremity/Trunk Assessment   Upper Extremity Assessment: Defer to OT evaluation           Lower Extremity Assessment: RLE deficits/detail RLE Deficits / Details: assist needed to advance Rt LE with swing phase of gait.        Communication   Communication: No difficulties  Cognition Arousal/Alertness:  Awake/alert Behavior During Therapy: WFL for tasks assessed/performed Overall Cognitive Status: History of cognitive impairments - at baseline (deficits confirmed per nursing, no family present)                 General Comments: Pt not consistent with reports of prior living situation. Initially reporting that he lives alone and then confirming that he lives with his sister. Pt also initially reporting that he was planning to D/C  to home but then confirmed that he will be going to SNF.     General Comments      Exercises        Assessment/Plan    PT Assessment Patient needs continued PT services  PT Diagnosis Difficulty walking   PT Problem List Decreased strength;Decreased range of motion;Decreased activity tolerance;Decreased balance;Decreased mobility;Decreased safety awareness  PT Treatment Interventions DME instruction;Gait training;Stair training;Functional mobility training;Therapeutic exercise;Therapeutic activities;Patient/family education   PT Goals (Current goals can be found in the Care Plan section) Acute Rehab PT Goals Patient Stated Goal: eventually get home PT Goal Formulation: With patient Time For Goal Achievement: 01/11/16 Potential to Achieve Goals: Good    Frequency 7X/week   Barriers to discharge        Co-evaluation               End of Session Equipment Utilized During Treatment: Gait belt Activity Tolerance: Patient tolerated treatment well Patient left:  (in care of OT) Nurse Communication: Mobility status         Time: 1610-9604 PT Time Calculation (min) (ACUTE ONLY): 22 min   Charges:   PT Evaluation $PT Eval Moderate Complexity: 1 Procedure     PT G Codes:        Christiane Ha, PT, CSCS Pager [phone] Office [phone]  12/28/2015, 10:49 AM

## 2015-12-28 NOTE — Progress Notes (Signed)
Patient ID: ROREY RAUF, male   DOB: [birth year], 55 y.o.   MRN: [id number] PATIENT ID: PRAYAN LANGEL  MRN: [id number]  DOB/AGE:  03-Aug-1960 / 55 y.o.  1 Day Post-Op Procedure(s) (LRB): RIGHT TOTAL HIP ARTHROPLASTY ANTERIOR APPROACH (Right)    PROGRESS NOTE Subjective: Patient is alert, oriented, no Nausea, no Vomiting, yes passing gas, . Taking PO well. Denies SOB, Chest or Calf Pain. Using Incentive Spirometer, PAS in place. Ambulate WBAT Patient reports pain as  8/10  .    Objective: Vital signs in last 24 hours: Vitals:   12/27/15 1412 12/27/15 1443 12/27/15 2137 12/28/15 0411  BP:  108/72 107/74 108/76  Pulse: 63 64 89 96  Resp: 14 16 16 18   Temp:  97.6 F (36.4 C) 99.4 F (37.4 C) 98.1 F (36.7 C)  TempSrc:  Axillary Oral Oral  SpO2: 100% 100% 99% 98%  Weight:          Intake/Output from previous day: I/O last 3 completed shifts: In: 1200 [I.V.:1200] Out: 1300 [Urine:1050; Blood:250]   Intake/Output this shift: No intake/output data recorded.   LABORATORY DATA:  Recent Labs  12/28/15 0552  WBC 7.8  HGB 11.5*  HCT 35.4*  PLT 311    Examination: Neurologically intact ABD soft Neurovascular intact Sensation intact distally Intact pulses distally Dorsiflexion/Plantar flexion intact Incision: dressing C/D/I No cellulitis present Compartment soft} XR AP&Lat of hip shows well placed\fixed THA  Assessment:   1 Day Post-Op Procedure(s) (LRB): RIGHT TOTAL HIP ARTHROPLASTY ANTERIOR APPROACH (Right) ADDITIONAL DIAGNOSIS:  Expected Acute Blood Loss Anemia, anxiety  Plan: PT/OT WBAT, THA  DVT Prophylaxis: SCDx72 hrs, ASA 325 mg BID x 2 weeks  DISCHARGE PLAN: Home  DISCHARGE NEEDS: HHPT, Walker and 3-in-1 comode seat

## 2015-12-28 NOTE — Clinical Social Work Note (Signed)
Clinical Social Work Assessment  Patient Details  Name: Dale Ross MRN: [id number] Date of Birth: Jun 18, 1960  Date of referral:  12/28/15               Reason for consult:  Facility Placement                Permission sought to share information with:   (None.) Permission granted to share information::  No  Name::        Agency::     Relationship::     Contact Information:     Housing/Transportation Living arrangements for the past 2 months:   (Patient is from [facility].) Source of Information:  Patient Patient Interpreter Needed:  None Criminal Activity/Legal Involvement Pertinent to Current Situation/Hospitalization:  No - Comment as needed Significant Relationships:  Siblings (Patient's emergency contact is his sister Darlene/[place] [phone]) Lives with:  Facility Resident Do you feel safe going back to the place where you live?  Yes (Patient confirms that upon discharge he would like to return to [facility].) Need for family participation in patient care:  Yes (Comment) (Outside supports would be helpful.)  Care giving concerns:  There are no care giving concerns at this time. The patient will be returning back to his facility/[facility].   Social Worker assessment / plan:  SW met with patient at bedside. He was alert and oriented. The patient has presented with has presented today for surgery, with the diagnosis of RIGHT HIP AVASCULAR NECROSIS. The patient was appropriate at bedside and does not state any concerns at this time. Patient confirms that he is from [place] and states that he would like to return to facility.  Employment status:    Insurance information:  Medicaid In [place] PT Recommendations:   (Patient will be returning back to [place].) Information / Referral to community resources:   (There are no other commnity resources needed at this time. The patient willbe returning to [facility].)  Patient/Family's Response to care:  Patient is appropriate  at this time.  Patient/Family's Understanding of and Emotional Response to Diagnosis, Current Treatment, and Prognosis:  Patient is understanding at this time and states that he has no questions.  Emotional Assessment Appearance:  Appears stated age Attitude/Demeanor/Rapport:   (Appropriate.) Affect (typically observed):  Accepting, Appropriate Orientation:  Oriented to Self, Oriented to Place, Oriented to  Time Alcohol / Substance use:  Not Applicable Psych involvement (Current and /or in the community):  No (Comment)  Discharge Needs  Concerns to be addressed:  Adjustment to Illness Readmission within the last 30 days:  No Current discharge risk:  None Barriers to Discharge:  No Barriers Identified   Bernita Buffy, LCSW 12/28/2015, 11:31 AM

## 2015-12-29 LAB — CBC
HEMATOCRIT: 34.5 % — AB (ref 39.0–52.0)
Hemoglobin: 11.5 g/dL — ABNORMAL LOW (ref 13.0–17.0)
MCH: 33.5 pg (ref 26.0–34.0)
MCHC: 33.3 g/dL (ref 30.0–36.0)
MCV: 100.6 fL — AB (ref 78.0–100.0)
Platelets: 290 10*3/uL (ref 150–400)
RBC: 3.43 MIL/uL — ABNORMAL LOW (ref 4.22–5.81)
RDW: 11.6 % (ref 11.5–15.5)
WBC: 15 10*3/uL — AB (ref 4.0–10.5)

## 2015-12-29 NOTE — Progress Notes (Signed)
PATIENT ID: Dale Ross  MRN: [id number]  DOB/AGE:  07/09/1960 / 55 y.o.  2 Days Post-Op Procedure(s) (LRB): RIGHT TOTAL HIP ARTHROPLASTY ANTERIOR APPROACH (Right)    PROGRESS NOTE Subjective: Patient is alert, oriented, mild Nausea, and one episode yesterday of Vomiting, none this AM, yes passing gas, . Taking PO well. Denies SOB, Chest or Calf Pain. Using Incentive Spirometer, PAS in place. Ambulate WBAT with pt walking 15 ft with therapy Patient reports pain as  moderate  .    Objective: Vital signs in last 24 hours: Vitals:   12/28/15 0411 12/28/15 1438 12/28/15 2155 12/29/15 0500  BP: 108/76 113/74 125/83 111/68  Pulse: 96 95 98 83  Resp: 18 18 18  (!) 148  Temp: 98.1 F (36.7 C) 98.4 F (36.9 C) 99.5 F (37.5 C) 98.6 F (37 C)  TempSrc: Oral  Oral Oral  SpO2: 98% 99% 98% 100%  Weight:          Intake/Output from previous day: I/O last 3 completed shifts: In: 720 [P.O.:720] Out: 1550 [Urine:1550]   Intake/Output this shift: No intake/output data recorded.   LABORATORY DATA:  Recent Labs  12/28/15 0552 12/29/15 0624  WBC 7.8 15.0*  HGB 11.5* 11.5*  HCT 35.4* 34.5*  PLT 311 290    Examination: Neurologically intact Neurovascular intact Sensation intact distally Intact pulses distally Dorsiflexion/Plantar flexion intact Incision: dressing C/D/I No cellulitis present Compartment soft} XR AP&Lat of hip shows well placed\fixed THA  Assessment:   2 Days Post-Op Procedure(s) (LRB): RIGHT TOTAL HIP ARTHROPLASTY ANTERIOR APPROACH (Right) ADDITIONAL DIAGNOSIS:  Expected Acute Blood Loss Anemia, anxiety  Plan: PT/OT WBAT, THA  DVT Prophylaxis: SCDx72 hrs, ASA 325 mg BID x 2 weeks  DISCHARGE PLAN: Skilled Nursing Facility/Rehab  DISCHARGE NEEDS: HHPT, Walker and 3-in-1 comode seat

## 2015-12-29 NOTE — Discharge Summary (Signed)
Patient ID: Dale Ross MRN: [id number] DOB/AGE: 1961-01-30 55 y.o.  Admit date: 12/27/2015 Discharge date: 12/29/2015  Admission Diagnoses:  Principal Problem:   Avascular necrosis of bone of right hip [facility]) Active Problems:   Arthritis of right hip   Discharge Diagnoses:  Same  Past Medical History:  Diagnosis Date  . Anxiety   . Arthritis   . Cognitive deficits    learning disabilities  . Dental caries   . Seasonal allergies     Surgeries: Procedure(s): RIGHT TOTAL HIP ARTHROPLASTY ANTERIOR APPROACH on 12/27/2015   Consultants:   Discharged Condition: Improved  Hospital Course: Dale Ross is an 55 y.o. male who was admitted 12/27/2015 for operative treatment ofAvascular necrosis of bone of right hip (HCC). Patient has severe unremitting pain that affects sleep, daily activities, and work/hobbies. After pre-op clearance the patient was taken to the operating room on 12/27/2015 and underwent  Procedure(s): RIGHT TOTAL HIP ARTHROPLASTY ANTERIOR APPROACH.    Patient was given perioperative antibiotics: Anti-infectives    Start     Dose/Rate Route Frequency Ordered Stop   12/27/15 0600  ceFAZolin (ANCEF) IVPB 2g/100 mL premix     2 g 200 mL/hr over 30 Minutes Intravenous On call to O.R. 12/26/15 1350 12/27/15 1048       Patient was given sequential compression devices, early ambulation, and chemoprophylaxis to prevent DVT.  Patient benefited maximally from hospital stay and there were no complications.    Recent vital signs: Patient Vitals for the past 24 hrs:  BP Temp Temp src Pulse Resp SpO2  12/29/15 0500 111/68 98.6 F (37 C) Oral 83 (!) 148 100 %  12/28/15 2155 125/83 99.5 F (37.5 C) Oral 98 18 98 %  12/28/15 1438 113/74 98.4 F (36.9 C) - 95 18 99 %     Recent laboratory studies:  Recent Labs  12/28/15 0552 12/29/15 0624  WBC 7.8 15.0*  HGB 11.5* 11.5*  HCT 35.4* 34.5*  PLT 311 290     Discharge Medications:     Medication List     STOP taking these medications   ALEVE 220 MG Caps Generic drug:  Naproxen Sodium     TAKE these medications   acetaminophen 325 MG tablet Commonly known as:  TYLENOL Take 650 mg by mouth every 4 (four) hours as needed for mild pain or moderate pain.   aspirin EC 325 MG tablet Take 1 tablet (325 mg total) by mouth 2 (two) times daily.   ENSURE PLUS Liqd Take 237 mLs by mouth daily as needed.   fexofenadine 60 MG tablet Commonly known as:  ALLEGRA Take 60 mg by mouth 2 (two) times daily as needed for allergies or rhinitis.   oxyCODONE-acetaminophen 5-325 MG tablet Commonly known as:  ROXICET Take 1 tablet by mouth every 4 (four) hours as needed.   tiZANidine 2 MG tablet Commonly known as:  ZANAFLEX Take 1 tablet (2 mg total) by mouth every 6 (six) hours as needed for muscle spasms.   vitamin B-12 1000 MCG tablet Commonly known as:  CYANOCOBALAMIN Take 1,000 mcg by mouth daily.       Diagnostic Studies: Dg C-arm 1-60 Min  Result Date: 12/27/2015 CLINICAL DATA:  Operative imaging for right hip arthroplasty. EXAM: DG C-ARM 61-120 MIN; OPERATIVE RIGHT HIP WITH PELVIS COMPARISON:  09/01/2015 FINDINGS: Two submitted images show a total right hip arthroplasty. The acetabular and femoral components appear well seated and aligned. No acute fracture or evidence of an operative complication. IMPRESSION:  Well-positioned right hip total arthroplasty. Electronically Signed   By: Amie Portland M.D.   On: 12/27/2015 12:40  Dg Hip Operative Unilat With Pelvis Right  Result Date: 12/27/2015 CLINICAL DATA:  Operative imaging for right hip arthroplasty. EXAM: DG C-ARM 61-120 MIN; OPERATIVE RIGHT HIP WITH PELVIS COMPARISON:  09/01/2015 FINDINGS: Two submitted images show a total right hip arthroplasty. The acetabular and femoral components appear well seated and aligned. No acute fracture or evidence of an operative complication. IMPRESSION: Well-positioned right hip total arthroplasty.  Electronically Signed   By: Amie Portland M.D.   On: 12/27/2015 12:40   Disposition: 03-Skilled Nursing Facility  Discharge Instructions    Call MD / Call 911    Complete by:  As directed   If you experience chest pain or shortness of breath, CALL 911 and be transported to the hospital emergency room.  If you develope a fever above 101 F, pus (white drainage) or increased drainage or redness at the wound, or calf pain, call your surgeon's office.   Constipation Prevention    Complete by:  As directed   Drink plenty of fluids.  Prune juice may be helpful.  You may use a stool softener, such as Colace (over the counter) 100 mg twice a day.  Use MiraLax (over the counter) for constipation as needed.   Diet - low sodium heart healthy    Complete by:  As directed   Driving restrictions    Complete by:  As directed   No driving for 2 weeks   Follow the hip precautions as taught in Physical Therapy    Complete by:  As directed   Increase activity slowly as tolerated    Complete by:  As directed   Patient may shower    Complete by:  As directed   You may shower without a dressing once there is no drainage.  Do not wash over the wound.  If drainage remains, cover wound with plastic wrap and then shower.      Follow-up Information    Nestor Lewandowsky, MD Follow up in 2 week(s).   Specialty:  Orthopedic Surgery Contact information: [address] [place] [address] [phone]            Signed: Henry Russel 12/29/2015, 7:31 AM

## 2015-12-29 NOTE — Progress Notes (Signed)
SW spoke with [facility] [place] who states she is accepting the patient.   SW spoke with Judithann Sauger of [facility] who states that they can transport pt due to him having a wheel chair. SW made nurse aware that transportation is here. Also, she provided information for nurse report.  Nurse Report Number: [phone] Room: [facility] [address] Child psychotherapist

## 2015-12-29 NOTE — Progress Notes (Signed)
Rept called to [facility] at [place]. No change in pt from AM assessment. Pt pain well controlled. Pt transferred to [facility] via wheelchair and [place] pt transfer Zenaida Niece without incident.

## 2015-12-29 NOTE — Plan of Care (Signed)
Problem: Activity: Goal: Will remain free from falls Outcome: Progressing No fall or injuries noted this shift, fall preventions and safety precautions maintained

## 2015-12-29 NOTE — Plan of Care (Signed)
Problem: Tissue Perfusion: Goal: Risk factors for ineffective tissue perfusion will decrease Outcome: Progressing No S/S of DVT noted, SCDs are on

## 2017-03-24 IMAGING — RF DG HIP (WITH PELVIS) OPERATIVE*R*
1 series · 2 of 2 positions shown · non-contrast
Comparison: 09/01/2015

CLINICAL DATA: Operative imaging for right hip arthroplasty.

EXAM:
DG C-ARM 61-120 MIN; OPERATIVE RIGHT HIP WITH PELVIS

[Series 1: run · 2 of 2 slices shown]
[im 1/2]
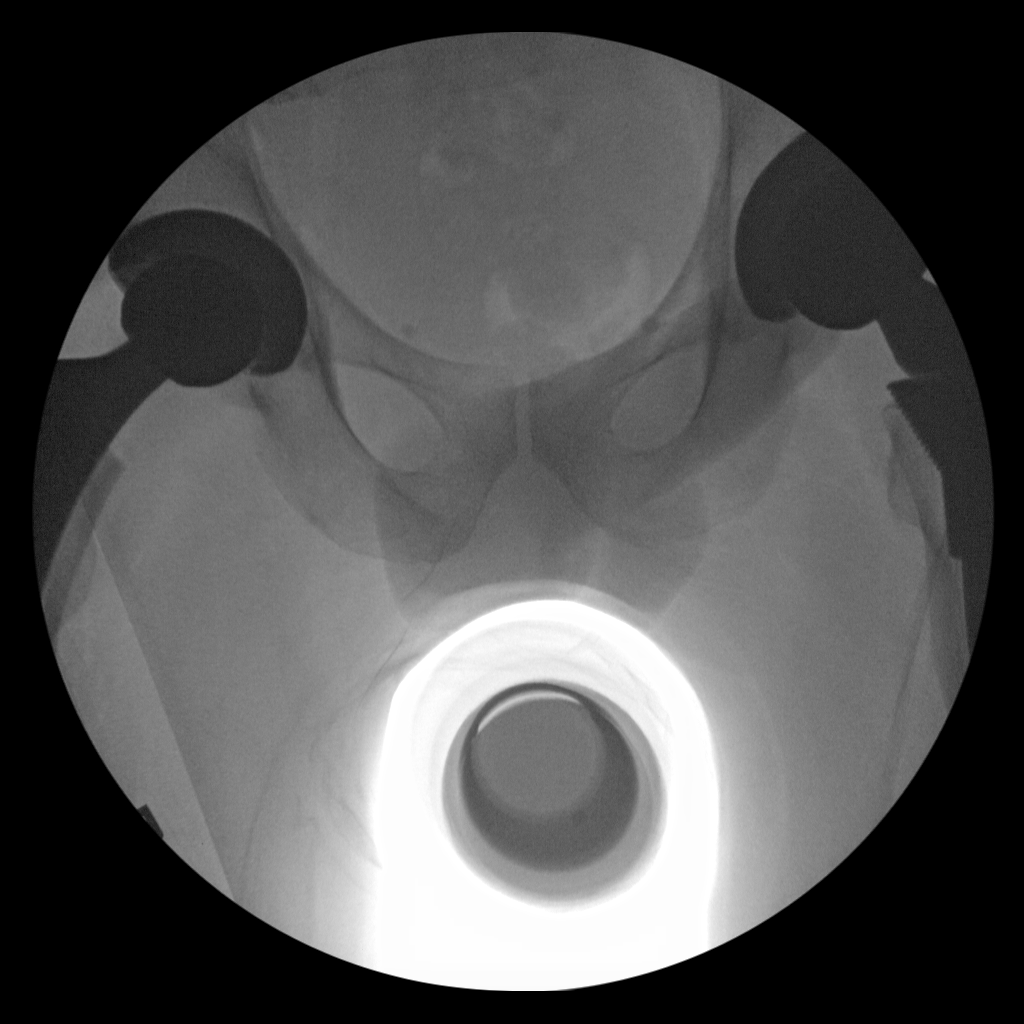
[im 2/2]
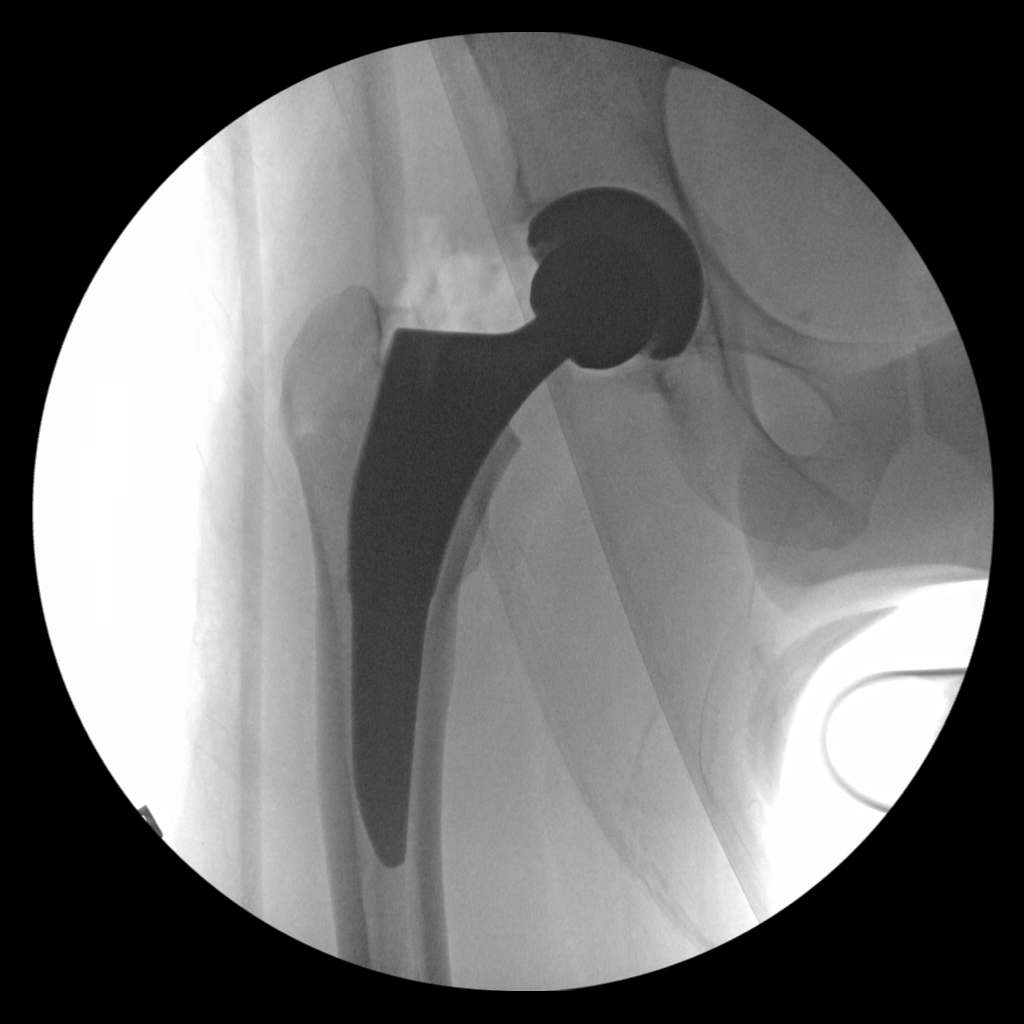

[2 of 2 positions shown; findings below may reference images not displayed]

FINDINGS: Two submitted images show a total right hip arthroplasty. The
acetabular and femoral components appear well seated and aligned.

No acute fracture or evidence of an operative complication.
IMPRESSION: Well-positioned right hip total arthroplasty.

## 2020-07-20 ENCOUNTER — Emergency Department (HOSPITAL_COMMUNITY): Payer: Medicare Other

## 2020-07-20 ENCOUNTER — Emergency Department (HOSPITAL_COMMUNITY)
Admission: EM | Admit: 2020-07-20 | Discharge: 2020-07-20 | Disposition: A | Discharge status: Home / Self Care | Payer: Medicare Other | Attending: Emergency Medicine | Admitting: Emergency Medicine

## 2020-07-20 ENCOUNTER — Encounter (HOSPITAL_COMMUNITY): Payer: Self-pay | Admitting: Emergency Medicine

## 2020-07-20 ENCOUNTER — Other Ambulatory Visit: Payer: Self-pay

## 2020-07-20 DIAGNOSIS — F17210 Nicotine dependence, cigarettes, uncomplicated: Secondary | ICD-10-CM | POA: Insufficient documentation

## 2020-07-20 DIAGNOSIS — Z7982 Long term (current) use of aspirin: Secondary | ICD-10-CM | POA: Diagnosis not present

## 2020-07-20 DIAGNOSIS — M545 Low back pain, unspecified: Secondary | ICD-10-CM

## 2020-07-20 DIAGNOSIS — M5440 Lumbago with sciatica, unspecified side: Secondary | ICD-10-CM | POA: Insufficient documentation

## 2020-07-20 DIAGNOSIS — M549 Dorsalgia, unspecified: Secondary | ICD-10-CM | POA: Diagnosis present

## 2020-07-20 DIAGNOSIS — Z96643 Presence of artificial hip joint, bilateral: Secondary | ICD-10-CM | POA: Insufficient documentation

## 2020-07-20 NOTE — ED Triage Notes (Addendum)
Pt to triage via [facility] from [facility].  Pt reports lumbar pain that started this morning when bending over to tie shoes.

## 2020-07-20 NOTE — ED Notes (Signed)
Pt in xray

## 2020-07-20 NOTE — Discharge Instructions (Addendum)
The xrays of the thoracic and lumbar spine did not show any signs of injury or bony abnormality.  There was an incidental finding on your xray which is listed below:   "Amorphous calcifications within the abdomen of indeterminate etiology. Further evaluation with CT of the abdomen pelvis on a nonemergent/outpatient recommended."  You should follow-up with your regular doctor and have a CT scan of the abdomen/pelvis completed to further evaluate this finding.  If you have any new or worsening symptoms in the meantime please return to the emergency department immediately.

## 2020-07-20 NOTE — ED Notes (Signed)
Pt given turkey sandwich bag and sprite 

## 2020-07-20 NOTE — Progress Notes (Signed)
CSW consulted for transportation needs. Met with Pt at bedside. Able to assist with needs.   07/20/20 1835  TOC ED Mini Assessment  TOC Time spent with patient (minutes): 20  PING Used in TOC Assessment No  Admission or Readmission Diverted Yes  Interventions which prevented an admission or readmission Transportation Screening  What brought you to the Emergency Department?  Lumbar pain  Barriers to Discharge No Barriers Identified  Barrier interventions Transportation provided via [employer] of departure Taxi [place])

## 2020-07-20 NOTE — ED Provider Notes (Signed)
[facility] EMERGENCY DEPARTMENT Provider Note   CSN: [id number] Arrival date & time: 07/20/20  1214     History Chief Complaint  Patient presents with  . Back Pain    Dale Ross is a 60 y.o. male.  HPI   Patient is a 60 year old male with a history of anxiety, arthritis, cognitive deficit, dental caries, seasonal allergies who presents to the emergency department today for evaluation of back pain.  Patient states he was bending over to tie his shoes when he felt some pain in his back.  States he has had similar pain before and that it comes and goes.  At the time of my evaluation he states he has no pain at this time he only has some pain when he turns from side to side.  He denies any numbness or weakness in his legs.  Denies any loss control of bowel or bladder function.  Denies any history of IVDU, cancer.  Denies fevers.  Past Medical History:  Diagnosis Date  . Anxiety   . Arthritis   . Cognitive deficits    learning disabilities  . Dental caries   . Seasonal allergies     Patient Active Problem List   Diagnosis Date Noted  . Arthritis of right hip 12/27/2015  . Avascular necrosis of bone of right hip (HCC) 12/26/2015  . S/P total hip arthroplasty 10/15/2014  . Acute blood loss anemia 10/11/2014  . Seasonal allergies   . Avascular necrosis of bone of left hip (HCC) 09/30/2014  . Arthritis, hip 09/30/2014    Past Surgical History:  Procedure Laterality Date  . NO PAST SURGERIES    . TOTAL HIP ARTHROPLASTY Left 09/30/2014   Procedure: TOTAL HIP ARTHROPLASTY;  Surgeon: Gean Birchwood, MD;  Location: [facility] OR;  Service: Orthopedics;  Laterality: Left;  . TOTAL HIP ARTHROPLASTY Right 12/27/2015   Procedure: RIGHT TOTAL HIP ARTHROPLASTY ANTERIOR APPROACH;  Surgeon: Gean Birchwood, MD;  Location: [facility] OR;  Service: Orthopedics;  Laterality: Right;       No family history on file.  Social History   Tobacco Use  . Smoking status: Current Every Day Smoker     Packs/day: 1.50    Years: 36.00    Pack years: 54.00  . Smokeless tobacco: Current User  Substance Use Topics  . Alcohol use: Yes    Alcohol/week: 4.0 standard drinks    Types: 4 Cans of beer per week    Comment: daily  . Drug use: No    Comment: quit    Home Medications Prior to Admission medications   Medication Sig Start Date End Date Taking? Authorizing Provider  acetaminophen (TYLENOL) 325 MG tablet Take 650 mg by mouth every 4 (four) hours as needed for mild pain or moderate pain.    [provider]  aspirin EC 325 MG tablet Take 1 tablet (325 mg total) by mouth 2 (two) times daily. 12/27/15   Dannielle Burn K, PA-C  ENSURE PLUS (ENSURE PLUS) LIQD Take 237 mLs by mouth daily as needed.    [provider]  fexofenadine (ALLEGRA) 60 MG tablet Take 60 mg by mouth 2 (two) times daily as needed for allergies or rhinitis.    [provider]  oxyCODONE-acetaminophen (ROXICET) 5-325 MG tablet Take 1 tablet by mouth every 4 (four) hours as needed. 12/27/15   Allena Katz, PA-C  tiZANidine (ZANAFLEX) 2 MG tablet Take 1 tablet (2 mg total) by mouth every 6 (six) hours as needed for  muscle spasms. 12/27/15   Allena Katz, PA-C  vitamin B-12 (CYANOCOBALAMIN) 1000 MCG tablet Take 1,000 mcg by mouth daily.    [provider]    Allergies    No known allergies  Review of Systems   Review of Systems  Constitutional: Negative for fever.  Respiratory: Negative for shortness of breath.   Cardiovascular: Negative for chest pain.  Gastrointestinal: Negative for abdominal pain, diarrhea and nausea.  Musculoskeletal: Positive for back pain (resolved).  Neurological: Negative for weakness and numbness.    Physical Exam Updated Vital Signs BP 110/81   Pulse 63   Temp 98.1 F (36.7 C) (Oral)   Resp 18   SpO2 98%   Physical Exam Vitals and nursing note reviewed.  Constitutional:      Appearance: He is well-developed and well-nourished.  HENT:      Head: Normocephalic and atraumatic.  Eyes:     Conjunctiva/sclera: Conjunctivae normal.  Cardiovascular:     Rate and Rhythm: Normal rate and regular rhythm.     Heart sounds: No murmur heard.   Pulmonary:     Effort: Pulmonary effort is normal. No respiratory distress.     Breath sounds: Normal breath sounds.  Abdominal:     Palpations: Abdomen is soft.     Tenderness: There is no abdominal tenderness.  Musculoskeletal:        General: No edema.     Cervical back: Neck supple.       Back:     Comments: Pt states areas noted above are where he experienced pain initially however he does not have pain there at this time unless he turns from side to side. No midline TTP. 5/5 strength to the BLE  Skin:    General: Skin is warm and dry.  Neurological:     Mental Status: He is alert.  Psychiatric:        Mood and Affect: Mood and affect normal.     ED Results / Procedures / Treatments   Labs (all labs ordered are listed, but only abnormal results are displayed) Labs Reviewed - No data to display  EKG None  Radiology DG Thoracic Spine 2 View  Result Date: 07/20/2020 CLINICAL DATA:  Back pain EXAM: THORACIC SPINE 2 VIEWS COMPARISON:  None. FINDINGS: Slight dextrocurvature. Thoracic vertebral body heights are maintained. Intervertebral disc heights are preserved. IMPRESSION: No acute osseous abnormality. Electronically Signed   By: Guadlupe Spanish M.D.   On: 07/20/2020 15:50   DG Lumbar Spine Complete  Result Date: 07/20/2020 CLINICAL DATA:  60 year old male with back pain. EXAM: LUMBAR SPINE - COMPLETE 4+ VIEW COMPARISON:  None. FINDINGS: There is no acute fracture or subluxation of the lumbar spine. The visualized posterior elements are intact. Bilateral total hip arthroplasties. Amorphous calcifications within the abdomen measure up to 3 cm of indeterminate etiology may be related to prior granulomatous disease or calcified mesenteric mass. IMPRESSION: 1. No acute/traumatic  lumbar spine pathology. 2. Amorphous calcifications within the abdomen of indeterminate etiology. Further evaluation with CT of the abdomen pelvis on a nonemergent/outpatient recommended. Electronically Signed   By: Elgie Collard M.D.   On: 07/20/2020 15:48    Procedures Procedures   Medications Ordered in ED Medications - No data to display  ED Course  I have reviewed the triage vital signs and the nursing notes.  Pertinent labs & imaging results that were available during my care of the patient were reviewed by me and considered in my medical decision making (  see chart for details).    MDM Rules/Calculators/A&P                          61 y/o M presenting to the ED today for eval of back pain that occurred this morning when he bent down to tie his shoe. On my eval he states his symptoms have completed resolved. He has no reproducible TTP on exam, he is only able to reproduce sxs when he twists his back to the side. He is neuro intact. There are no red flag signs or sxs. Doubt dissection or other emergent cause.   Xray thoracic spine - No acute osseous abnormality. Xray lumbar spine - 1. No acute/traumatic lumbar spine pathology. 2. Amorphous calcifications within the abdomen of indeterminate etiology. Further evaluation with CT of the abdomen pelvis on a nonemergent/outpatient recommended.  - pt and facility made aware of findings  - I have low suspicion that this is contributing to patients pain today as he has been asymptomatic throughout his entire >4 hour ED stay  4:11 PM Attempted to contact patients sister Agustin Cree and was unable to reach anyone. Voicemail was not set up.   4:35 PM discussed case with Marcelino Duster at [facility].  Discussed that patient will need to have a follow-up CT abdomen/pelvis with his PCP to further evaluate the findings on his x-rays today.  She states she will give this message to the administrator and make sure that the patient follows up.  Pt w/o  emergent findings today that would require further work-up or admission to the hospital.  Patient discharged with Rx for Lidoderm patches.  Advised on return precautions.  Voiced understanding the plan and reasons to return.  All questions answered.  Final Clinical Impression(s) / ED Diagnoses Final diagnoses:  Acute low back pain, unspecified back pain laterality, unspecified whether sciatica present    Rx / DC Orders ED Discharge Orders    None       Karrie Meres, PA-C 07/20/20 1641    Melene Plan, DO 07/23/20 1500

## 2021-05-24 ENCOUNTER — Ambulatory Visit: Payer: Medicare Other | Admitting: Internal Medicine

## 2021-08-30 ENCOUNTER — Ambulatory Visit: Payer: Medicare Other | Admitting: Internal Medicine

## 2021-10-16 IMAGING — CR DG LUMBAR SPINE COMPLETE 4+V
4 series · 4 of 4 positions shown · non-contrast
Comparison: None.

CLINICAL DATA: 59-year-old male with back pain.

EXAM:
LUMBAR SPINE - COMPLETE 4+ VIEW

[l-spine ap]
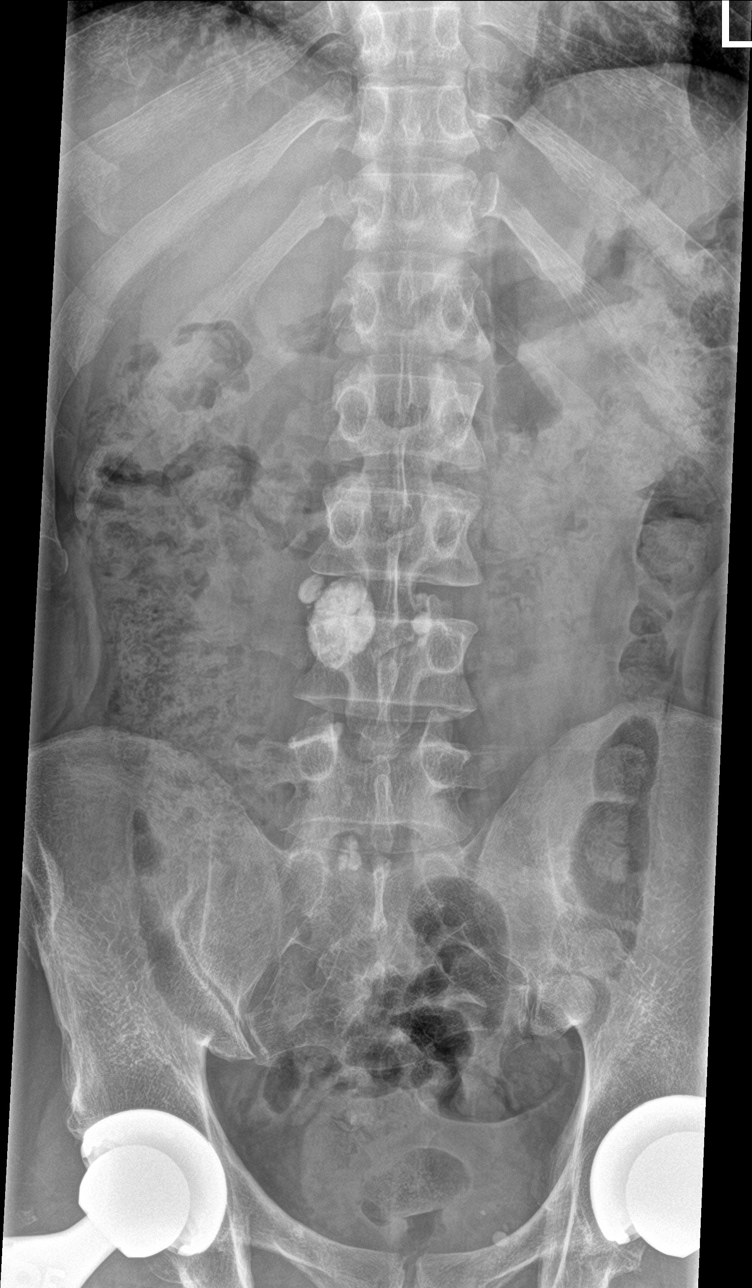

[l-spine obl (1 of 2)]
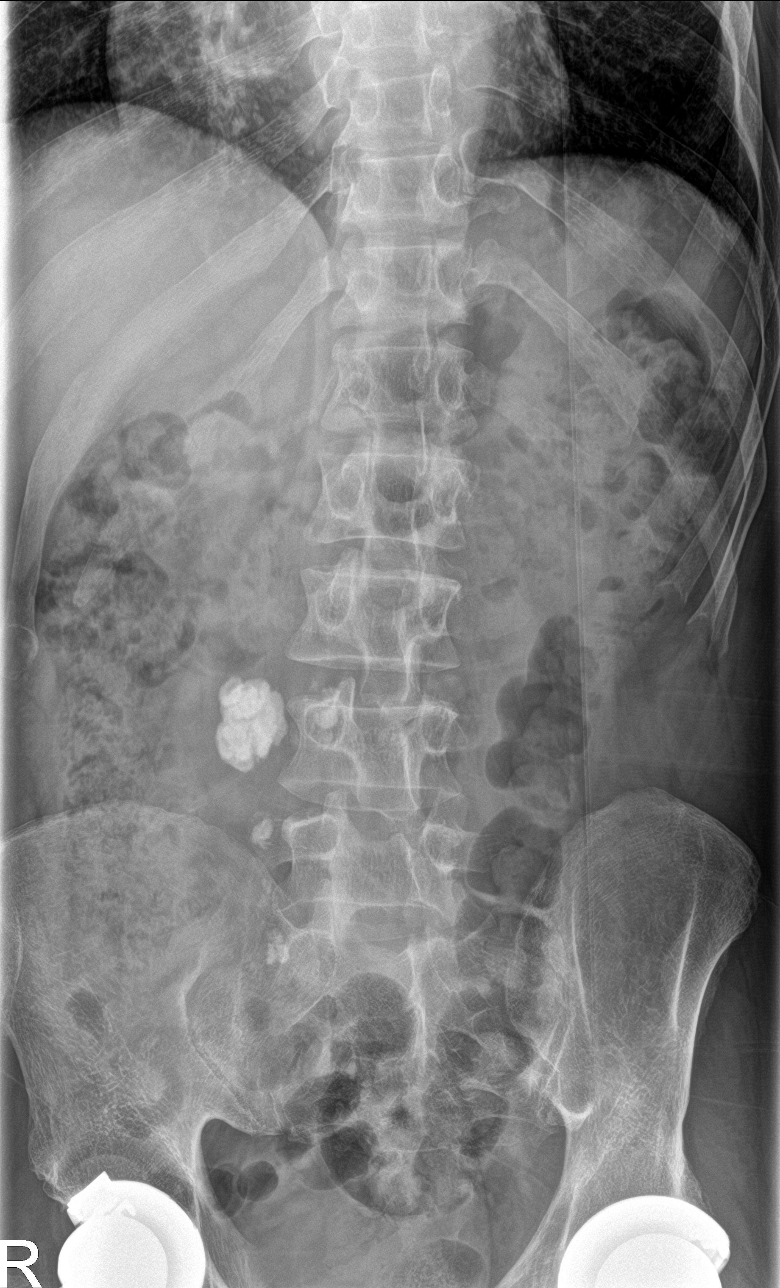

[l-spine obl (2 of 2)]
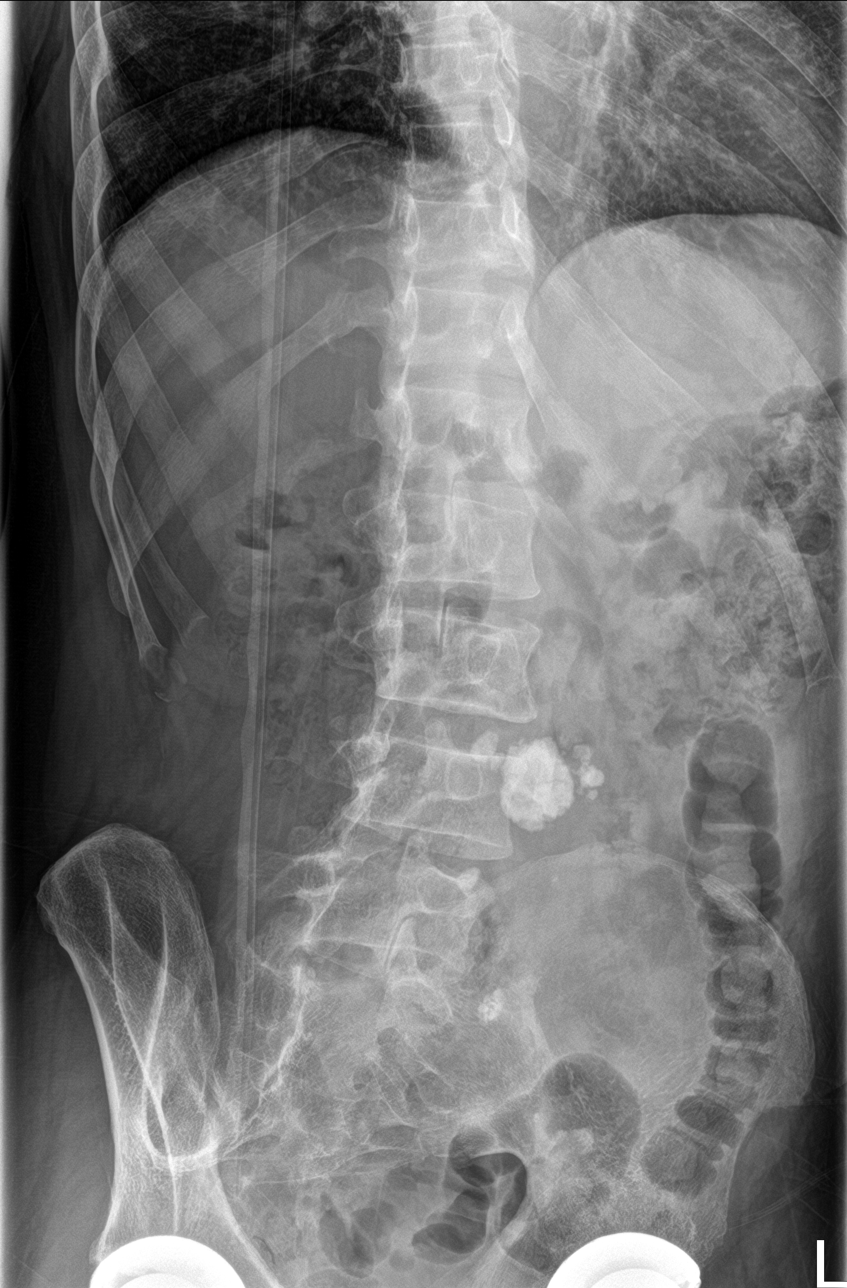

[l-spine lat]
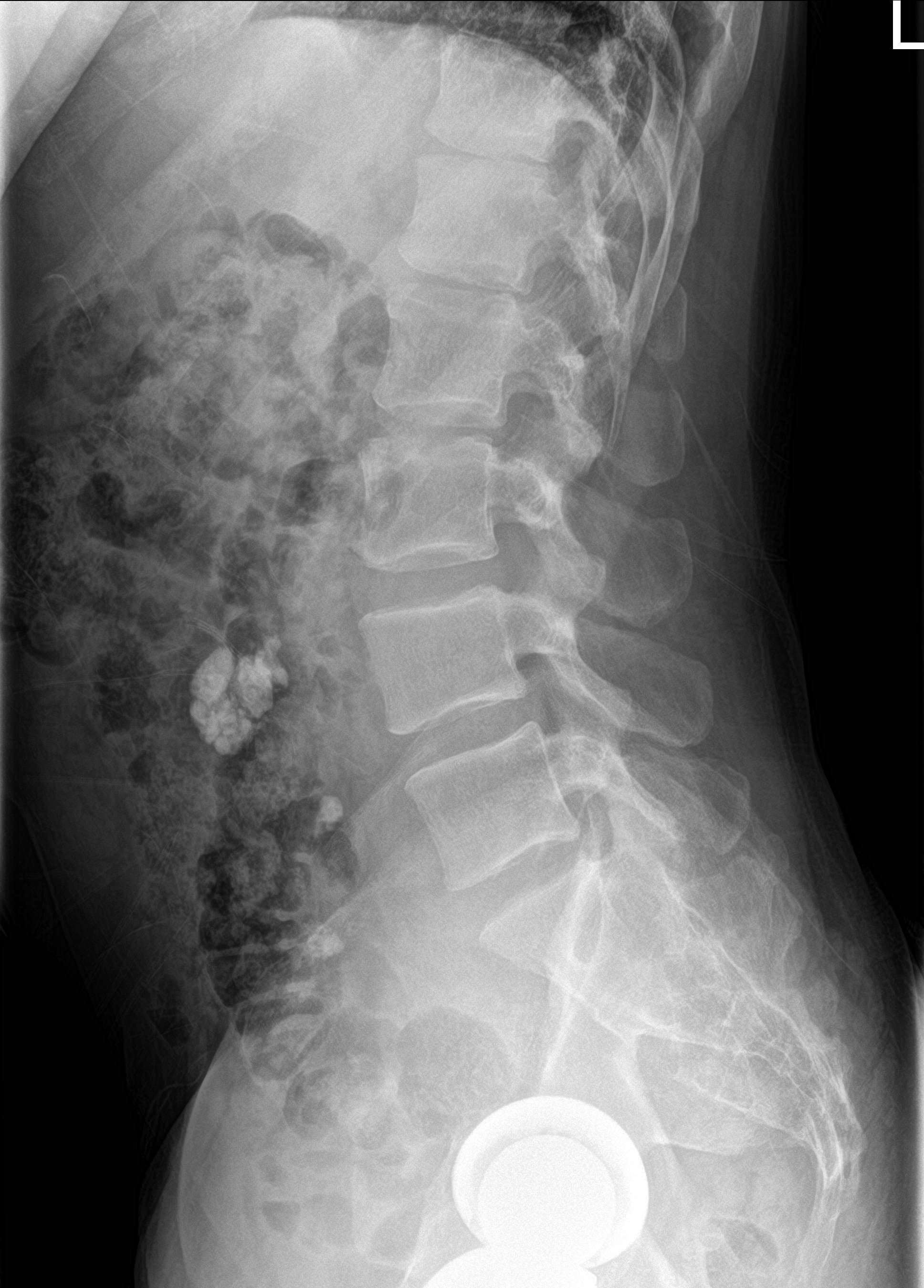

[4 of 4 positions shown; findings below may reference images not displayed]

FINDINGS: There is no acute fracture or subluxation of the lumbar spine. The
visualized posterior elements are intact. Bilateral total hip
arthroplasties. Amorphous calcifications within the abdomen measure
up to 3 cm of indeterminate etiology may be related to prior
granulomatous disease or calcified mesenteric mass.
IMPRESSION: 1. No acute/traumatic lumbar spine pathology.
2. Amorphous calcifications within the abdomen of indeterminate
etiology. Further evaluation with CT of the abdomen pelvis on a
nonemergent/outpatient recommended.

## 2021-12-03 ENCOUNTER — Emergency Department (HOSPITAL_COMMUNITY): Payer: Medicare Other

## 2021-12-03 ENCOUNTER — Other Ambulatory Visit: Payer: Self-pay

## 2021-12-03 ENCOUNTER — Emergency Department (HOSPITAL_COMMUNITY)
Admission: EM | Admit: 2021-12-03 | Discharge: 2021-12-03 | Disposition: A | Discharge status: Home / Self Care | Payer: Medicare Other | Attending: Student | Admitting: Student

## 2021-12-03 ENCOUNTER — Encounter (HOSPITAL_COMMUNITY): Payer: Self-pay

## 2021-12-03 DIAGNOSIS — S2242XA Multiple fractures of ribs, left side, initial encounter for closed fracture: Secondary | ICD-10-CM | POA: Diagnosis not present

## 2021-12-03 DIAGNOSIS — W228XXA Striking against or struck by other objects, initial encounter: Secondary | ICD-10-CM | POA: Diagnosis not present

## 2021-12-03 DIAGNOSIS — S2232XA Fracture of one rib, left side, initial encounter for closed fracture: Secondary | ICD-10-CM | POA: Insufficient documentation

## 2021-12-03 DIAGNOSIS — R7989 Other specified abnormal findings of blood chemistry: Secondary | ICD-10-CM | POA: Diagnosis not present

## 2021-12-03 DIAGNOSIS — Z96643 Presence of artificial hip joint, bilateral: Secondary | ICD-10-CM | POA: Insufficient documentation

## 2021-12-03 DIAGNOSIS — Y9372 Activity, wrestling: Secondary | ICD-10-CM | POA: Diagnosis not present

## 2021-12-03 DIAGNOSIS — Z7982 Long term (current) use of aspirin: Secondary | ICD-10-CM | POA: Diagnosis not present

## 2021-12-03 DIAGNOSIS — R911 Solitary pulmonary nodule: Secondary | ICD-10-CM | POA: Insufficient documentation

## 2021-12-03 DIAGNOSIS — F17210 Nicotine dependence, cigarettes, uncomplicated: Secondary | ICD-10-CM | POA: Diagnosis not present

## 2021-12-03 DIAGNOSIS — R791 Abnormal coagulation profile: Secondary | ICD-10-CM | POA: Diagnosis not present

## 2021-12-03 DIAGNOSIS — R091 Pleurisy: Secondary | ICD-10-CM | POA: Insufficient documentation

## 2021-12-03 DIAGNOSIS — R079 Chest pain, unspecified: Secondary | ICD-10-CM | POA: Diagnosis not present

## 2021-12-03 DIAGNOSIS — R0789 Other chest pain: Secondary | ICD-10-CM | POA: Diagnosis not present

## 2021-12-03 DIAGNOSIS — S299XXA Unspecified injury of thorax, initial encounter: Secondary | ICD-10-CM | POA: Diagnosis present

## 2021-12-03 LAB — CBC
HCT: 45.4 % (ref 39.0–52.0)
Hemoglobin: 15.6 g/dL (ref 13.0–17.0)
MCH: 34.5 pg — ABNORMAL HIGH (ref 26.0–34.0)
MCHC: 34.4 g/dL (ref 30.0–36.0)
MCV: 100.4 fL — ABNORMAL HIGH (ref 80.0–100.0)
Platelets: 410 10*3/uL — ABNORMAL HIGH (ref 150–400)
RBC: 4.52 MIL/uL (ref 4.22–5.81)
RDW: 12.1 % (ref 11.5–15.5)
WBC: 7.3 10*3/uL (ref 4.0–10.5)
nRBC: 0 % (ref 0.0–0.2)

## 2021-12-03 LAB — BASIC METABOLIC PANEL
Anion gap: 13 (ref 5–15)
BUN: 6 mg/dL — ABNORMAL LOW (ref 8–23)
CO2: 23 mmol/L (ref 22–32)
Calcium: 9 mg/dL (ref 8.9–10.3)
Chloride: 103 mmol/L (ref 98–111)
Creatinine, Ser: 0.89 mg/dL (ref 0.61–1.24)
GFR, Estimated: >60 mL/min (ref >60)
Glucose, Bld: 95 mg/dL (ref 70–99)
Potassium: 4 mmol/L (ref 3.5–5.1)
Sodium: 139 mmol/L (ref 135–145)

## 2021-12-03 LAB — TROPONIN I (HIGH SENSITIVITY)
Troponin I (High Sensitivity): 3 ng/L (ref <18)
Troponin I (High Sensitivity): <2 ng/L (ref <18)

## 2021-12-03 LAB — D-DIMER, QUANTITATIVE: D-Dimer, Quant: 0.7 ug/mL-FEU — ABNORMAL HIGH (ref 0.00–0.50)

## 2021-12-03 MED ORDER — NAPROXEN 375 MG PO TABS: 375.0000 mg | ORAL_TABLET | Freq: Two times a day (BID) | ORAL | 0 refills | Status: DC

## 2021-12-03 MED ORDER — IOHEXOL 300 MG/ML  SOLN
75.0000 mL | Freq: Once | INTRAMUSCULAR | Status: AC | PRN
  Administered 2021-12-03: 75 mL via INTRAVENOUS

## 2021-12-03 MED ORDER — IBUPROFEN 400 MG PO TABS
600.0000 mg | ORAL_TABLET | Freq: Once | ORAL | Status: DC
  Filled 2021-12-03: qty 1

## 2021-12-03 MED ORDER — LIDOCAINE 5 % EX PTCH
1.0000 | MEDICATED_PATCH | CUTANEOUS | Status: DC
  Administered 2021-12-03: 1 via TRANSDERMAL
  Filled 2021-12-03: qty 1

## 2021-12-03 MED ORDER — OXYCODONE HCL 5 MG PO TABS: 5.0000 mg | ORAL_TABLET | ORAL | 0 refills | Status: DC | PRN

## 2021-12-03 MED ORDER — LIDOCAINE 5 % EX PTCH: 1.0000 | MEDICATED_PATCH | CUTANEOUS | 0 refills | Status: DC

## 2021-12-03 MED ORDER — KETOROLAC TROMETHAMINE 15 MG/ML IJ SOLN
15.0000 mg | Freq: Once | INTRAMUSCULAR | Status: AC
  Administered 2021-12-03: 15 mg via INTRAVENOUS
  Filled 2021-12-03: qty 1

## 2021-12-03 NOTE — ED Triage Notes (Signed)
Patient complains of sharp chest pain since am. Pain with movement, inspiration and to touch. No cough, no trauma
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
## 2021-12-03 NOTE — ED Provider Triage Note (Signed)
Emergency Medicine Provider Triage Evaluation Note  LYDON VANSICKLE , a 61 y.o. male  was evaluated in triage.  Pt complains of 61 year old male here with intermittent left-sided sharp chest pain, worse with palpation of the left chest.  Has been present for the past 2 days.  Patient is a very poor historian has a history of cognitive deficits..  Review of Systems  Positive: Left-sided chest pain Negative: Shortness of breath  Physical Exam  BP (!) 106/52 (BP Location: Left Arm)   Pulse 97   Temp 97.9 F (36.6 C) (Oral)   Resp 16   SpO2 93%  Gen:   Awake, no distress   Resp:  Normal effort  MSK:   Moves extremities without difficulty  Other:  Tender palpation of the left chest   Medical Decision Making  Medically screening exam initiated at 11:27 AM.  Appropriate orders placed.  MOHAMEDAMIN NIFONG was informed that the remainder of the evaluation will be completed by another provider, this initial triage assessment does not replace that evaluation, and the importance of remaining in the ED until their evaluation is complete.  Work-up initiated   Arthor Captain, PA-C 12/03/21 1128

## 2021-12-03 NOTE — ED Notes (Signed)
Patient transported to CT 

## 2021-12-29 ENCOUNTER — Encounter: Payer: Self-pay | Admitting: Internal Medicine

## 2021-12-29 ENCOUNTER — Ambulatory Visit: Payer: Medicare Other | Attending: Internal Medicine | Admitting: Internal Medicine

## 2021-12-29 VITALS — BP 110/72 | HR 71 | Temp 98.1°F | Ht 63.0 in | Wt 131.6 lb

## 2021-12-29 DIAGNOSIS — Z1211 Encounter for screening for malignant neoplasm of colon: Secondary | ICD-10-CM

## 2021-12-29 DIAGNOSIS — Z23 Encounter for immunization: Secondary | ICD-10-CM | POA: Diagnosis not present

## 2021-12-29 DIAGNOSIS — R911 Solitary pulmonary nodule: Secondary | ICD-10-CM | POA: Insufficient documentation

## 2021-12-29 DIAGNOSIS — Z87891 Personal history of nicotine dependence: Secondary | ICD-10-CM | POA: Diagnosis not present

## 2021-12-29 DIAGNOSIS — F1090 Alcohol use, unspecified, uncomplicated: Secondary | ICD-10-CM

## 2021-12-29 DIAGNOSIS — D7589 Other specified diseases of blood and blood-forming organs: Secondary | ICD-10-CM

## 2021-12-29 DIAGNOSIS — Z7689 Persons encountering health services in other specified circumstances: Secondary | ICD-10-CM

## 2021-12-29 DIAGNOSIS — L602 Onychogryphosis: Secondary | ICD-10-CM

## 2021-12-29 DIAGNOSIS — S2232XA Fracture of one rib, left side, initial encounter for closed fracture: Secondary | ICD-10-CM

## 2021-12-29 NOTE — Progress Notes (Signed)
Pt would like referral to podiatry and would like imaging of the chest due to previous fractured rib from a month ago.

## 2021-12-29 NOTE — Progress Notes (Signed)
Patient ID: Dale Ross, male    DOB: October 30, 1960  MRN: [id number]  CC: New Patient (Initial Visit)   Subjective: Dale Ross is a 61 y.o. male who presents for new pt visit.  Dale Ross, his cousin and care taker, is with him.  His concerns today include:  Patient with history of avascular necrosis of the hips, status post BL THR, seasonal allergies  Previous PCP was at [facility].  Last seen 2 yrs ago.  Was at an ALF for awhile (ALF at [facility] went out of business); now lives with [name] for past 5 mths.   Seen in ER 12/03/2021 for LT sided CP after playing wrestling with his friend.  Found to have an acute mildly displaced LT anterior 3rd rib fx.  Incidental finding of 3.7 mm LT lung nodule. Pt was 1 pk a day smoker since age 72.  Quit after told of nodule on lung in ER last mth. Deveda reports pt drinks heavy - would drink four 20 oz beer plus a 40 oz several days a wk.  Would drink until he is not able to stand up "just falling down drunk" and becomes belligerent to everyone around him.     He has decreased drinking significantly since ER visit.  Since then he has only had 1 beer.  Today patient reports that the pain in the rib has significantly decreased.  He denies any shortness of breath.  Macrocytosis on CBC done in ER Endorses numbness and cold feeling in hands  His cousin is requesting a referral for him to see a podiatrist to have his toenails clipped.  HM:  Due for Tdapt and Shingrix.  Due for colon CA screen.  No fhx of colon-CA.   Patient Active Problem List   Diagnosis Date Noted   Alcohol use disorder 12/29/2021   Lung nodule 12/29/2021   Macrocytosis without anemia 12/29/2021   Arthritis of right hip 12/27/2015   Avascular necrosis of bone of right hip (HCC) 12/26/2015   S/P total hip arthroplasty 10/15/2014   Acute blood loss anemia 10/11/2014   Seasonal allergies    Avascular necrosis of bone of left hip (HCC) 09/30/2014   Arthritis, hip 09/30/2014      Current Outpatient Medications on File Prior to Visit  Medication Sig Dispense Refill   acetaminophen (TYLENOL) 325 MG tablet Take 650 mg by mouth every 4 (four) hours as needed for mild pain or moderate pain.     aspirin EC 325 MG tablet Take 1 tablet (325 mg total) by mouth 2 (two) times daily. 30 tablet 0   ENSURE PLUS (ENSURE PLUS) LIQD Take 237 mLs by mouth daily as needed.     fexofenadine (ALLEGRA) 60 MG tablet Take 60 mg by mouth 2 (two) times daily as needed for allergies or rhinitis.     lidocaine (LIDODERM) 5 % Place 1 patch onto the skin daily. Remove & Discard patch within 12 hours or as directed by MD 30 patch 0   tiZANidine (ZANAFLEX) 2 MG tablet Take 1 tablet (2 mg total) by mouth every 6 (six) hours as needed for muscle spasms. 60 tablet 0   vitamin B-12 (CYANOCOBALAMIN) 1000 MCG tablet Take 1,000 mcg by mouth daily.     naproxen (NAPROSYN) 375 MG tablet Take 1 tablet (375 mg total) by mouth 2 (two) times daily. (Patient not taking: Reported on 12/29/2021) 20 tablet 0   oxyCODONE (ROXICODONE) 5 MG immediate release tablet Take 1 tablet (5 mg  total) by mouth every 4 (four) hours as needed for severe pain or breakthrough pain. (Patient not taking: Reported on 12/29/2021) 10 tablet 0   oxyCODONE-acetaminophen (ROXICET) 5-325 MG tablet Take 1 tablet by mouth every 4 (four) hours as needed. (Patient not taking: Reported on 12/29/2021) 60 tablet 0   No current facility-administered medications on file prior to visit.    Allergies  Allergen Reactions   No Known Allergies     Social History   Socioeconomic History   Marital status: Single    Spouse name: Not on file   Number of children: Not on file   Years of education: Not on file   Highest education level: Not on file  Occupational History   Not on file  Tobacco Use   Smoking status: Every Day    Packs/day: 1.50    Years: 36.00    Total pack years: 54.00    Types: Cigarettes   Smokeless tobacco: Current  Substance and  Sexual Activity   Alcohol use: Yes    Alcohol/week: 4.0 standard drinks of alcohol    Types: 4 Cans of beer per week    Comment: daily   Drug use: No    Comment: quit   Sexual activity: Not on file  Other Topics Concern   Not on file  Social History Narrative   Not on file   Social Determinants of Health   Financial Resource Strain: Not on file  Food Insecurity: Not on file  Transportation Needs: Not on file  Physical Activity: Not on file  Stress: Not on file  Social Connections: Not on file  Intimate Partner Violence: Not on file    No family history on file.  Past Surgical History:  Procedure Laterality Date   NO PAST SURGERIES     TOTAL HIP ARTHROPLASTY Left 09/30/2014   Procedure: TOTAL HIP ARTHROPLASTY;  Surgeon: Gean Birchwood, MD;  Location: [facility] OR;  Service: Orthopedics;  Laterality: Left;   TOTAL HIP ARTHROPLASTY Right 12/27/2015   Procedure: RIGHT TOTAL HIP ARTHROPLASTY ANTERIOR APPROACH;  Surgeon: Gean Birchwood, MD;  Location: [facility] OR;  Service: Orthopedics;  Laterality: Right;    ROS: Review of Systems Negative except as stated above  PHYSICAL EXAM: BP 110/72   Pulse 71   Temp 98.1 F (36.7 C) (Oral)   Ht 5\' 3"  (1.6 m)   Wt 131 lb 9.6 oz (59.7 kg)   SpO2 100%   BMI 23.31 kg/m   Physical Exam  General appearance - alert, well appearing, older African-American male and in no distress Mental status - normal mood, behavior, speech, dress, motor activity, and thought processes Mouth - mucous membranes moist, pharynx normal without lesions Neck - supple, no significant adenopathy Chest - clear to auscultation, no wheezes, rales or rhonchi, symmetric air entry Heart - normal rate, regular rhythm, normal S1, S2, no murmurs, rubs, clicks or gallops Extremities -no lower extremity edema.  He has good radial pulses at the wrists. Nails: Toenails are thick, discolored and significantly overgrown.    Latest Ref Rng & Units 12/03/2021   11:14 AM 12/17/2015    2:40 PM  10/01/2014    4:17 AM  CMP  Glucose 70 - 99 mg/dL 95  88  12/01/2014   BUN 8 - 23 mg/dL 6  16  6    Creatinine 0.61 - 1.24 mg/dL 161   0.96   Sodium 135 - 145 mmol/L 139  134  135   Potassium 3.5 - 5.1 mmol/L 4.0  4.6  4.6   Chloride 98 - 111 mmol/L 103  100  101   CO2 22 - 32 mmol/L 23  28  24    Calcium 8.9 - 10.3 mg/dL 9.0  9.0  8.5     CBC    Component Value Date/Time   WBC 7.3 12/03/2021 1114   RBC 4.52 12/03/2021 1114   HGB 15.6 12/03/2021 1114   HCT 45.4 12/03/2021 1114   PLT 410 (H) 12/03/2021 1114   MCV 100.4 (H) 12/03/2021 1114   MCH 34.5 (H) 12/03/2021 1114   MCHC 34.4 12/03/2021 1114   RDW 12.1 12/03/2021 1114   LYMPHSABS 2.1 12/17/2015 1440   MONOABS 0.5 12/17/2015 1440   EOSABS 0.2 12/17/2015 1440   BASOSABS 0.0 12/17/2015 1440    ASSESSMENT AND PLAN: 1. Encounter to establish care   2. Closed fracture of one rib of left side, initial encounter Advised patient that it usually takes about 6 to 8 weeks for the rib fracture to heal.  The fact that the pain has significantly increased suggest that he is well on his way to healing.  3. Former smoker Commended him on quitting.  Encouraged him to remain tobacco free especially in light of the fact that he had now has a nodule on the lungs.  Discussed health risks associated with smoking.  4. Alcohol use disorder Commended him on cutting back significantly. Discussed health risks associated with alcohol abuse.  Encouraged him to remain completely alcohol free. - Hepatic Function Panel  5. Overgrown toenails - Ambulatory referral to Podiatry  6. Lung nodule Plan to repeat lung nodule in 6 to 12 months as he is at high risk given history of cigarette smoking.  7. Macrocytosis without anemia - Vitamin B12 - Folate  8. Need for Tdap vaccination Patient agreed to receive vaccine today. - Tdap vaccine greater than or equal to 7yo IM  9. Need for shingles vaccine Agreed to receive first Shingrix shot today.   Informed that it can cause some redness and swelling at the injection site that can last for several days. - Varicella-zoster vaccine IM  10. Screening for colon cancer Referral submitted for colonoscopy.    Patient was given the opportunity to ask questions.  Patient verbalized understanding of the plan and was able to repeat key elements of the plan.   This documentation was completed using 12/19/2015.  Any transcriptional errors are unintentional.  Orders Placed This Encounter  Procedures   Varicella-zoster vaccine IM   Tdap vaccine greater than or equal to 7yo IM   Hepatic Function Panel   Vitamin B12   Folate   Ambulatory referral to Podiatry   Ambulatory referral to Gastroenterology     Requested Prescriptions    No prescriptions requested or ordered in this encounter    Return in about 4 months (around 04/30/2022) for Appt with 14/07/2021 in 4 wks for Franky Macho.  [facility], MD, FACP

## 2021-12-29 NOTE — Patient Instructions (Signed)
I encourage you to remain free of cigarettes and alcohol use.

## 2021-12-30 LAB — HEPATIC FUNCTION PANEL
ALT: 35 IU/L (ref 0–44)
AST: 32 IU/L (ref 0–40)
Albumin: 4.9 g/dL (ref 3.9–4.9)
Alkaline Phosphatase: 121 IU/L (ref 44–121)
Bilirubin Total: 0.4 mg/dL (ref 0.0–1.2)
Bilirubin, Direct: 0.12 mg/dL (ref 0.00–0.40)
Total Protein: 7.7 g/dL (ref 6.0–8.5)

## 2021-12-30 LAB — VITAMIN B12: Vitamin B-12: 695 pg/mL (ref 232–1245)

## 2021-12-30 LAB — FOLATE: Folate: 7.9 ng/mL (ref >3.0)

## 2022-01-20 DIAGNOSIS — F1090 Alcohol use, unspecified, uncomplicated: Secondary | ICD-10-CM | POA: Diagnosis not present

## 2022-01-20 DIAGNOSIS — F819 Developmental disorder of scholastic skills, unspecified: Secondary | ICD-10-CM | POA: Diagnosis not present

## 2022-01-20 DIAGNOSIS — Z0000 Encounter for general adult medical examination without abnormal findings: Secondary | ICD-10-CM | POA: Diagnosis not present

## 2022-01-20 DIAGNOSIS — R911 Solitary pulmonary nodule: Secondary | ICD-10-CM | POA: Diagnosis not present

## 2022-01-20 DIAGNOSIS — F17200 Nicotine dependence, unspecified, uncomplicated: Secondary | ICD-10-CM | POA: Diagnosis not present

## 2022-01-20 DIAGNOSIS — Z5900 Homelessness unspecified: Secondary | ICD-10-CM | POA: Diagnosis not present

## 2022-02-02 ENCOUNTER — Encounter: Payer: Medicare Other | Admitting: Pharmacist

## 2022-03-06 ENCOUNTER — Encounter (HOSPITAL_COMMUNITY): Payer: Self-pay | Admitting: Emergency Medicine

## 2022-03-06 ENCOUNTER — Ambulatory Visit (HOSPITAL_COMMUNITY)
Admission: EM | Admit: 2022-03-06 | Discharge: 2022-03-06 | Disposition: A | Discharge status: Home / Self Care | Payer: Medicare Other | Attending: Internal Medicine | Admitting: Internal Medicine

## 2022-03-06 DIAGNOSIS — H2512 Age-related nuclear cataract, left eye: Secondary | ICD-10-CM | POA: Diagnosis not present

## 2022-03-06 DIAGNOSIS — Z1152 Encounter for screening for COVID-19: Secondary | ICD-10-CM | POA: Insufficient documentation

## 2022-03-06 DIAGNOSIS — H40023 Open angle with borderline findings, high risk, bilateral: Secondary | ICD-10-CM | POA: Diagnosis not present

## 2022-03-06 DIAGNOSIS — B349 Viral infection, unspecified: Secondary | ICD-10-CM | POA: Insufficient documentation

## 2022-03-06 DIAGNOSIS — H524 Presbyopia: Secondary | ICD-10-CM | POA: Diagnosis not present

## 2022-03-06 MED ORDER — BENZONATATE 100 MG PO CAPS: 100.0000 mg | ORAL_CAPSULE | Freq: Three times a day (TID) | ORAL | 0 refills | Status: DC

## 2022-03-06 NOTE — ED Triage Notes (Signed)
Pt reports cough that started last night. Reports getting up "cold".

## 2022-03-06 NOTE — Discharge Instructions (Addendum)
I am treating you for a viral illness today.  We performed a COVID test, we will call you if the test results are positive.  You can check your MyChart to see test results.  Viral illnesses usually takes 7 to 10 days to resolve.  Using over-the-counter medications can help treat the symptoms.  You may rotate Tylenol and ibuprofen every 4-6 hours.  For example, take Tylenol now and then 4 to 6 hours later take ibuprofen.  You can use Tylenol for fever and moderate pain, you can take this medication every 4-6 hours, please do not take more than 3000 mg in a 24-hour day.  You can take ibuprofen every 6 hours, do not take more than 2400 mg in a 24-hour day.  I advised that you do not take ibuprofen on an empty stomach, ibuprofen can cause GI problems such as GI bleeding.  I have sent Tessalon Pearls to the pharmacy, you can take this every 8 hours as needed for cough.  For nasal congestion, you can buy Flonase nasal spray over-the-counter, this is an intranasal steroid.  You will put 2 sprays in each nostril during application.  I suggest that you would use Flonase upon waking in the morning and in the evening.  Mucinex is an expectorant that can be purchased over-the-counter, this can help clear congestion from your respiratory tract.  For Mucinex dosage, please refer to the back of the box for dosage instructions, this can vary depending on the brand of medication.  Chloraseptic throat spray and lozenges can be used for sore throat.  You may use warm liquids and honey for symptom management.  Maintaining hydration status is very important, please drink at least 8 cups of water daily.  Please try to intake nutrient dense meals.  Chloraseptic throat spray and lozenges can be used for sore throat.  You may use warm liquids and honey for symptom management.   Maintaining hydration status is very important, please drink at least 8 cups of water daily.  Please try to intake nutrient dense meals.

## 2022-03-06 NOTE — ED Provider Notes (Signed)
[facility]    CSN: [id number] Arrival date & time: 03/06/22  1743      History   Chief Complaint Chief Complaint  Patient presents with   Cough    HPI Dale Ross is a 61 y.o. male.  Patient c/o productive cough that started last night.  Patient reports nasal drainage and nasal congestion.  Patient denies shortness of breath.  Patient denies any known exposures to any illnesses at home or at work.  Patient denies any sore throat, ear pain, or sinus pain.  Patient reports history of tobacco use.  Patient has used NyQuil with some relief of symptoms.   Cough Associated symptoms: rhinorrhea   Associated symptoms: no chills, no ear pain, no fever, no shortness of breath, no sore throat and no wheezing     Past Medical History:  Diagnosis Date   Anxiety    Arthritis    Cognitive deficits    learning disabilities   Dental caries    Seasonal allergies     Patient Active Problem List   Diagnosis Date Noted   Alcohol use disorder 12/29/2021   Lung nodule 12/29/2021   Macrocytosis without anemia 12/29/2021   Arthritis of right hip 12/27/2015   S/P total hip arthroplasty 10/15/2014   Acute blood loss anemia 10/11/2014   Seasonal allergies    Arthritis, hip 09/30/2014    Past Surgical History:  Procedure Laterality Date   NO PAST SURGERIES     TOTAL HIP ARTHROPLASTY Left 09/30/2014   Procedure: TOTAL HIP ARTHROPLASTY;  Surgeon: Gean Birchwood, MD;  Location: [facility] OR;  Service: Orthopedics;  Laterality: Left;   TOTAL HIP ARTHROPLASTY Right 12/27/2015   Procedure: RIGHT TOTAL HIP ARTHROPLASTY ANTERIOR APPROACH;  Surgeon: Gean Birchwood, MD;  Location: [facility] OR;  Service: Orthopedics;  Laterality: Right;       Home Medications    Prior to Admission medications   Medication Sig Start Date End Date Taking? Authorizing Provider  benzonatate (TESSALON) 100 MG capsule Take 1 capsule (100 mg total) by mouth every 8 (eight) hours. 03/06/22  Yes Debby Freiberg, NP   acetaminophen (TYLENOL) 325 MG tablet Take 650 mg by mouth every 4 (four) hours as needed for mild pain or moderate pain.    [provider]  aspirin EC 325 MG tablet Take 1 tablet (325 mg total) by mouth 2 (two) times daily. 12/27/15   Dannielle Burn K, PA-C  ENSURE PLUS (ENSURE PLUS) LIQD Take 237 mLs by mouth daily as needed.    [provider]  fexofenadine (ALLEGRA) 60 MG tablet Take 60 mg by mouth 2 (two) times daily as needed for allergies or rhinitis.    [provider]  lidocaine (LIDODERM) 5 % Place 1 patch onto the skin daily. Remove & Discard patch within 12 hours or as directed by MD 12/03/21   Kommor, Wyn Forster, MD  tiZANidine (ZANAFLEX) 2 MG tablet Take 1 tablet (2 mg total) by mouth every 6 (six) hours as needed for muscle spasms. 12/27/15   Allena Katz, PA-C  vitamin B-12 (CYANOCOBALAMIN) 1000 MCG tablet Take 1,000 mcg by mouth daily.    [provider]    Family History History reviewed. No pertinent family history.  Social History Social History   Tobacco Use   Smoking status: Every Day    Packs/day: 1.50    Years: 36.00    Total pack years: 54.00    Types: Cigarettes   Smokeless tobacco: Current  Substance Use Topics  Alcohol use: Yes    Alcohol/week: 4.0 standard drinks of alcohol    Types: 4 Cans of beer per week    Comment: daily   Drug use: No    Comment: quit     Allergies   No known allergies   Review of Systems Review of Systems  Constitutional:  Negative for activity change, appetite change, chills, fatigue and fever.  HENT:  Positive for congestion and rhinorrhea. Negative for ear discharge, ear pain, sinus pressure, sinus pain, sneezing and sore throat.   Respiratory:  Positive for cough. Negative for chest tightness, shortness of breath and wheezing.   Cardiovascular: Negative.   Gastrointestinal:  Negative for abdominal pain, constipation, diarrhea, nausea and vomiting.     Physical Exam Triage Vital  Signs ED Triage Vitals  Enc Vitals Group     BP 03/06/22 1924 131/79     Pulse Rate 03/06/22 1924 95     Resp 03/06/22 1924 20     Temp 03/06/22 1924 98.6 F (37 C)     Temp Source 03/06/22 1924 Oral     SpO2 03/06/22 1924 100 %     Weight --      Height --      Head Circumference --      Peak Flow --      Pain Score 03/06/22 1923 0     Pain Loc --      Pain Edu? --      Excl. in [place]? --    No data found.  Updated Vital Signs BP 131/79 (BP Location: Left Arm)   Pulse 95 Comment: coughing  Temp 98.6 F (37 C) (Oral)   Resp 20   SpO2 100%       Physical Exam Vitals and nursing note reviewed.  Constitutional:      Appearance: Normal appearance.  HENT:     Right Ear: Hearing, tympanic membrane, ear canal and external ear normal.     Left Ear: Hearing, tympanic membrane, ear canal and external ear normal.     Nose: Congestion (Clear nasal drainage) and rhinorrhea present. No nasal tenderness.     Right Turbinates: Not enlarged, swollen or pale.     Left Turbinates: Not enlarged, swollen or pale.     Right Sinus: No maxillary sinus tenderness or frontal sinus tenderness.     Left Sinus: No maxillary sinus tenderness or frontal sinus tenderness.     Mouth/Throat:     Mouth: Mucous membranes are moist.     Pharynx: Oropharynx is clear. Uvula midline. No pharyngeal swelling, oropharyngeal exudate, posterior oropharyngeal erythema or uvula swelling.     Tonsils: No tonsillar exudate.  Cardiovascular:     Rate and Rhythm: Normal rate and regular rhythm.  Pulmonary:     Effort: Pulmonary effort is normal. No tachypnea or bradypnea.     Breath sounds: Normal breath sounds and air entry. No decreased breath sounds, wheezing, rhonchi or rales.  Lymphadenopathy:     Cervical: No cervical adenopathy.  Neurological:     Mental Status: He is alert.      UC Treatments / Results  Labs (all labs ordered are listed, but only abnormal results are displayed) Labs Reviewed  SARS  CORONAVIRUS 2 (TAT 6-24 HRS)    EKG   Radiology No results found.  Procedures Procedures (including critical care time)  Medications Ordered in UC Medications - No data to display  Initial Impression / Assessment and Plan / UC Course  I have  reviewed the triage vital signs and the nursing notes.  Pertinent labs & imaging results that were available during my care of the patient were reviewed by me and considered in my medical decision making (see chart for details).     Patient was diagnosed with a viral illness today.  Patient was made aware of symptom management of viral illness and timeframe for relief of symptoms.  Jerilynn Som were sent to the pharmacy for cough.  A COVID test was performed.  Counseling was given for tobacco cessation.  Patient stated he is trying to reduce tobacco intake.  Patient education was given to try and reduce tobacco intake while recovering from viral illness to aid with coverage. Patient verbalized understanding of instructions. Final Clinical Impressions(s) / UC Diagnoses   Final diagnoses:  Viral illness     Discharge Instructions      I am treating you for a viral illness today.  We performed a COVID test, we will call you if the test results are positive.  You can check your MyChart to see test results.  Viral illnesses usually takes 7 to 10 days to resolve.  Using over-the-counter medications can help treat the symptoms.  You may rotate Tylenol and ibuprofen every 4-6 hours.  For example, take Tylenol now and then 4 to 6 hours later take ibuprofen.  You can use Tylenol for fever and moderate pain, you can take this medication every 4-6 hours, please do not take more than 3000 mg in a 24-hour day.  You can take ibuprofen every 6 hours, do not take more than 2400 mg in a 24-hour day.  I advised that you do not take ibuprofen on an empty stomach, ibuprofen can cause GI problems such as GI bleeding.  I have sent Tessalon Pearls to the  pharmacy, you can take this every 8 hours as needed for cough.  For nasal congestion, you can buy Flonase nasal spray over-the-counter, this is an intranasal steroid.  You will put 2 sprays in each nostril during application.  I suggest that you would use Flonase upon waking in the morning and in the evening.  Mucinex is an expectorant that can be purchased over-the-counter, this can help clear congestion from your respiratory tract.  For Mucinex dosage, please refer to the back of the box for dosage instructions, this can vary depending on the brand of medication.  Chloraseptic throat spray and lozenges can be used for sore throat.  You may use warm liquids and honey for symptom management.  Maintaining hydration status is very important, please drink at least 8 cups of water daily.  Please try to intake nutrient dense meals.  Chloraseptic throat spray and lozenges can be used for sore throat.  You may use warm liquids and honey for symptom management.   Maintaining hydration status is very important, please drink at least 8 cups of water daily.  Please try to intake nutrient dense meals.      ED Prescriptions     Medication Sig Dispense Auth. Provider   benzonatate (TESSALON) 100 MG capsule Take 1 capsule (100 mg total) by mouth every 8 (eight) hours. 16 capsule Debby Freiberg, NP      PDMP not reviewed this encounter.   Debby Freiberg, NP 03/06/22 2025

## 2022-03-07 LAB — SARS CORONAVIRUS 2 (TAT 6-24 HRS): SARS Coronavirus 2: NEGATIVE

## 2022-04-17 ENCOUNTER — Encounter: Payer: Self-pay | Admitting: Internal Medicine

## 2022-05-04 ENCOUNTER — Encounter: Payer: Self-pay | Admitting: Internal Medicine

## 2022-05-04 ENCOUNTER — Ambulatory Visit: Payer: Medicare Other | Attending: Internal Medicine | Admitting: Internal Medicine

## 2022-05-04 VITALS — BP 112/74 | HR 63 | Temp 97.9°F | Ht 63.0 in | Wt 130.0 lb

## 2022-05-04 DIAGNOSIS — Z1159 Encounter for screening for other viral diseases: Secondary | ICD-10-CM | POA: Diagnosis not present

## 2022-05-04 DIAGNOSIS — R911 Solitary pulmonary nodule: Secondary | ICD-10-CM

## 2022-05-04 DIAGNOSIS — Z23 Encounter for immunization: Secondary | ICD-10-CM

## 2022-05-04 DIAGNOSIS — Z720 Tobacco use: Secondary | ICD-10-CM | POA: Diagnosis not present

## 2022-05-04 DIAGNOSIS — Z1211 Encounter for screening for malignant neoplasm of colon: Secondary | ICD-10-CM | POA: Diagnosis not present

## 2022-05-04 DIAGNOSIS — F1090 Alcohol use, unspecified, uncomplicated: Secondary | ICD-10-CM

## 2022-05-04 DIAGNOSIS — Z114 Encounter for screening for human immunodeficiency virus [HIV]: Secondary | ICD-10-CM

## 2022-05-04 NOTE — Progress Notes (Signed)
Patient ID: Dale Ross, male    DOB: 06-13-1960  MRN: [id number]  CC: Follow-up (No questions / concerns. Valentino Hue to flu vax.)   Subjective: Dale Ross is a 61 y.o. male who presents for chronic ds management. Pt here by himself part of visit.  His cousin and care taker Ms. Margo Aye was invited to join as at the end.  She was sitting in lobby His concerns today include:  Patient with history of former smoker, ETOH UD, macrocytosis (nl folate/vit B12), lung nodule, avascular necrosis of the hips, status post BL THR, seasonal allergies   ETOH use disorder:  reports he drinks "now and then"  but nothing like he was drinking before.  Last drink a few drinks on Thanksgiving Day a 40 oz On last visit he told me he quit smoking 11/2021 after LT lung nodule seen on CT of chest.  However he reports that he smoked a few cigarettes on Thanksgiving day while he was hanging out with other people. His cousin reports that they had experience homelessness from August of this year until recently.  They have now secured stable housing. He is eating and sleeping okay. He has not had any further falls. Patient had CTA of the chest in July of this year through the emergency room.  Left lung nodule seen.  Follow-up CT in 6 to 12 months recommended.  Referred to gastroenterology for colonoscopy on last visit.  [facility] has tried reaching him.  Patient states that his phone has been disconnected.    HM:  due for flu and 2nd shingles vaccine.  Due for HIV/hep C screen Patient Active Problem List   Diagnosis Date Noted   Alcohol use disorder 12/29/2021   Lung nodule 12/29/2021   Macrocytosis without anemia 12/29/2021   Arthritis of right hip 12/27/2015   S/P total hip arthroplasty 10/15/2014   Acute blood loss anemia 10/11/2014   Seasonal allergies    Arthritis, hip 09/30/2014     Current Outpatient Medications on File Prior to Visit  Medication Sig Dispense Refill   fexofenadine  (ALLEGRA) 60 MG tablet Take 60 mg by mouth 2 (two) times daily as needed for allergies or rhinitis.     vitamin B-12 (CYANOCOBALAMIN) 1000 MCG tablet Take 1,000 mcg by mouth daily.     No current facility-administered medications on file prior to visit.    Allergies  Allergen Reactions   No Known Allergies     Social History   Socioeconomic History   Marital status: Single    Spouse name: Not on file   Number of children: Not on file   Years of education: Not on file   Highest education level: Not on file  Occupational History   Not on file  Tobacco Use   Smoking status: Every Day    Packs/day: 1.50    Years: 36.00    Total pack years: 54.00    Types: Cigarettes   Smokeless tobacco: Current  Substance and Sexual Activity   Alcohol use: Yes    Alcohol/week: 4.0 standard drinks of alcohol    Types: 4 Cans of beer per week    Comment: daily   Drug use: No    Comment: quit   Sexual activity: Not on file  Other Topics Concern   Not on file  Social History Narrative   Not on file   Social Determinants of Health   Financial Resource Strain: Not on file  Food Insecurity: Not on file  Transportation Needs: Not on file  Physical Activity: Not on file  Stress: Not on file  Social Connections: Not on file  Intimate Partner Violence: Not on file    No family history on file.  Past Surgical History:  Procedure Laterality Date   NO PAST SURGERIES     TOTAL HIP ARTHROPLASTY Left 09/30/2014   Procedure: TOTAL HIP ARTHROPLASTY;  Surgeon: Gean Birchwood, MD;  Location: [facility];  Service: Orthopedics;  Laterality: Left;   TOTAL HIP ARTHROPLASTY Right 12/27/2015   Procedure: RIGHT TOTAL HIP ARTHROPLASTY ANTERIOR APPROACH;  Surgeon: Gean Birchwood, MD;  Location: [facility] OR;  Service: Orthopedics;  Laterality: Right;    ROS: Review of Systems Negative except as stated above  PHYSICAL EXAM: BP 112/74 (BP Location: Left Arm, Patient Position: Sitting, Cuff Size: Normal)   Pulse 63    Temp 97.9 F (36.6 C) (Oral)   Ht 5\' 3"  (1.6 m)   Wt 130 lb (59 kg)   SpO2 99%   BMI 23.03 kg/m   Wt Readings from Last 3 Encounters:  05/04/22 130 lb (59 kg)  12/29/21 131 lb 9.6 oz (59.7 kg)  12/27/15 119 lb (54 kg)    Physical Exam   General appearance - alert, well appearing, older African-American male and in no distress Mental status -patient answers questions appropriately. Neck - supple, no significant adenopathy Chest - clear to auscultation, no wheezes, rales or rhonchi, symmetric air entry Heart - normal rate, regular rhythm, normal S1, S2, no murmurs, rubs, clicks or gallops Extremities - peripheral pulses normal, no pedal edema, no clubbing or cyanosis     Latest Ref Rng & Units 12/29/2021    3:07 PM 12/03/2021   11:14 AM 12/17/2015    2:40 PM  CMP  Glucose 70 - 99 mg/dL  95  88   BUN 8 - 23 mg/dL  6  16   Creatinine 12/19/2015 - 1.24 mg/dL  1.61  0.96   Sodium 0.45 - 145 mmol/L  139  134   Potassium 3.5 - 5.1 mmol/L  4.0  4.6   Chloride 98 - 111 mmol/L  103  100   CO2 22 - 32 mmol/L  23  28   Calcium 8.9 - 10.3 mg/dL  9.0  9.0   Total Protein 6.0 - 8.5 g/dL 7.7     Total Bilirubin 0.0 - 1.2 mg/dL 0.4     Alkaline Phos 44 - 121 IU/L 121     AST 0 - 40 IU/L 32     ALT 0 - 44 IU/L 35      Lipid Panel  No results found for: "CHOL", "TRIG", "HDL", "CHOLHDL", "VLDL", "LDLCALC", "LDLDIRECT"  CBC    Component Value Date/Time   WBC 7.3 12/03/2021 1114   RBC 4.52 12/03/2021 1114   HGB 15.6 12/03/2021 1114   HCT 45.4 12/03/2021 1114   PLT 410 (H) 12/03/2021 1114   MCV 100.4 (H) 12/03/2021 1114   MCH 34.5 (H) 12/03/2021 1114   MCHC 34.4 12/03/2021 1114   RDW 12.1 12/03/2021 1114   LYMPHSABS 2.1 12/17/2015 1440   MONOABS 0.5 12/17/2015 1440   EOSABS 0.2 12/17/2015 1440   BASOSABS 0.0 12/17/2015 1440    ASSESSMENT AND PLAN:  1. Alcohol use disorder Encouraged him to try to remain free of alcohol.  However if he does drink, advised not to drink more than 2  standard drinks in 1 setting.  I went over with him what a standard drink is.  2.  Lung nodule We will plan to order CT of the chest for follow-up on his subsequent visit.  3. Screening for colon cancer I copied the information for [facility] including their phone number on his discharge summary.  I have encouraged his cousin Ms. Hall to give them a call to get him scheduled for his colonoscopy.  4. Need for hepatitis C screening test Patient agreeable to screening. - Hepatitis C Antibody  5. Screening for HIV (human immunodeficiency virus) Agreeable to screening. - HIV antibody (with reflex)  6. Current occasional smoker Only advised him to avoid smoking altogether and being around other people who smoke because it is too easy to get back into the habit of being an every day smoker.  He agrees and expressed understanding.  7. Need for shingles vaccine Second Shingrix given today.  8. Need for immunization against influenza - Flu Vaccine QUAD 31mo+IM (Fluarix, Fluzone & Alfiuria Quad PF)    Patient was given the opportunity to ask questions.  Patient verbalized understanding of the plan and was able to repeat key elements of the plan.   This documentation was completed using Paediatric nurse.  Any transcriptional errors are unintentional.  Orders Placed This Encounter  Procedures   Flu Vaccine QUAD 16mo+IM (Fluarix, Fluzone & Alfiuria Quad PF)   Varicella-zoster vaccine IM   Hepatitis C Antibody   HIV antibody (with reflex)     Requested Prescriptions    No prescriptions requested or ordered in this encounter    Return in about 4 weeks (around 06/01/2022) for [facility]Or Medicare Wellness visit.  Jonah Blue, MD, FACP

## 2022-05-04 NOTE — Patient Instructions (Signed)
Please call  [facility]  to schedule your colonoscopy.  They have been trying to reach you via phone.  [address] [place], [place] [address] PH# [phone]

## 2022-05-05 LAB — HIV ANTIBODY (ROUTINE TESTING W REFLEX): HIV Screen 4th Generation wRfx: NONREACTIVE

## 2022-05-05 LAB — HEPATITIS C ANTIBODY: Hep C Virus Ab: NONREACTIVE

## 2022-05-08 ENCOUNTER — Telehealth: Payer: Self-pay

## 2022-05-08 NOTE — Telephone Encounter (Signed)
Pt was called and is aware of results, DOB was confirmed.  ?

## 2022-05-08 NOTE — Telephone Encounter (Signed)
-----   Message from Marcine Matar, MD sent at 05/05/2022  7:19 AM EST ----- Screen for HIV and hepatitis C was negative.

## 2022-06-02 ENCOUNTER — Ambulatory Visit: Payer: Medicare Other | Attending: Internal Medicine

## 2022-06-02 DIAGNOSIS — Z0000 Encounter for general adult medical examination without abnormal findings: Secondary | ICD-10-CM

## 2022-06-02 NOTE — Progress Notes (Addendum)
Subjective:   Dale Ross is a 62 y.o. male who presents for Medicare Annual/Subsequent preventive examination.  Review of Systems     I connected with Dale Ross on 06/02/2022 at 2:30 pm by telephone and verified that I am speaking with the correct person using two identifiers. I discussed the limitations, risks, security and privacy concerns of performing an evaluation and management service by telephone and the availability of in person appointments. I also discussed with the patient that there may be a patient responsible charge related to this service. The patient expressed understanding and agreed to proceed.   Patient location: Home My Location: [place] on the telephone call: Myself and [place]     Cardiac Risk Factors include: none     Objective:    There were no vitals filed for this visit. There is no height or weight on file to calculate BMI.     06/02/2022    2:26 PM 12/03/2021   11:14 AM 12/17/2015    2:48 PM 12/17/2015    2:29 PM 10/02/2014    1:00 AM 09/18/2014    2:56 PM  Advanced Directives  Does Patient Have a Medical Advance Directive? No No Yes No No No  Type of Advance Directive   Living will     Does patient want to make changes to medical advance directive?   Yes - information given     Would patient like information on creating a medical advance directive? Yes (ED - Information included in AVS) No - Patient declined   No - patient declined information No - patient declined information    Current Medications (verified) Outpatient Encounter Medications as of 06/02/2022  Medication Sig   fexofenadine (ALLEGRA) 60 MG tablet Take 60 mg by mouth 2 (two) times daily as needed for allergies or rhinitis.   vitamin B-12 (CYANOCOBALAMIN) 1000 MCG tablet Take 1,000 mcg by mouth daily.   No facility-administered encounter medications on file as of 06/02/2022.    Allergies (verified) No known allergies   History: Past Medical History:   Diagnosis Date   Anxiety    Arthritis    Cognitive deficits    learning disabilities   Dental caries    Seasonal allergies    Past Surgical History:  Procedure Laterality Date   NO PAST SURGERIES     TOTAL HIP ARTHROPLASTY Left 09/30/2014   Procedure: TOTAL HIP ARTHROPLASTY;  Surgeon: Frederik Pear, MD;  Location: [facility];  Service: Orthopedics;  Laterality: Left;   TOTAL HIP ARTHROPLASTY Right 12/27/2015   Procedure: RIGHT TOTAL HIP ARTHROPLASTY ANTERIOR APPROACH;  Surgeon: Frederik Pear, MD;  Location: [facility];  Service: Orthopedics;  Laterality: Right;   History reviewed. No pertinent family history. Social History   Socioeconomic History   Marital status: Single    Spouse name: Not on file   Number of children: Not on file   Years of education: Not on file   Highest education level: Not on file  Occupational History   Not on file  Tobacco Use   Smoking status: Every Day    Packs/day: 1.50    Years: 36.00    Total pack years: 54.00    Types: Cigarettes   Smokeless tobacco: Current  Substance and Sexual Activity   Alcohol use: Yes    Alcohol/week: 4.0 standard drinks of alcohol    Types: 4 Cans of beer per week    Comment: daily   Drug use: No    Comment:  quit   Sexual activity: Not on file  Other Topics Concern   Not on file  Social History Narrative   Not on file   Social Determinants of Health   Financial Resource Strain: Low Risk  (06/02/2022)   Overall Financial Resource Strain (CARDIA)    Difficulty of Paying Living Expenses: Not hard at all  Food Insecurity: Food Insecurity Present (06/02/2022)   Hunger Vital Sign    Worried About Running Out of Food in the Last Year: Sometimes true    Ran Out of Food in the Last Year: Sometimes true  Transportation Needs: No Transportation Needs (06/02/2022)   PRAPARE - Administrator, Civil Service (Medical): No    Lack of Transportation (Non-Medical): No  Physical Activity: Inactive (06/02/2022)   Exercise Vital Sign     Days of Exercise per Week: 0 days    Minutes of Exercise per Session: 0 min  Stress: No Stress Concern Present (06/02/2022)   [employer] of Occupational Health - Occupational Stress Questionnaire    Feeling of Stress : Not at all  Social Connections: Socially Isolated (06/02/2022)   Social Connection and Isolation Panel [NHANES]    Frequency of Communication with Friends and Family: Never    Frequency of Social Gatherings with Friends and Family: Never    Attends Religious Services: Never    Database administrator or Organizations: No    Attends Engineer, structural: Never    Marital Status: Never married    Tobacco Counseling Ready to quit: Yes Counseling given: Yes   Clinical Intake:  Pre-visit preparation completed: No  Pain : No/denies pain     Diabetes: No  How often do you need to have someone help you when you read instructions, pamphlets, or other written materials from your doctor or pharmacy?: 1 - Never  Diabetic?No  Interpreter Needed?: No      Activities of Daily Living    06/02/2022    2:42 PM  In your present state of health, do you have any difficulty performing the following activities:  Hearing? 0  Vision? 1  Difficulty concentrating or making decisions? 1  Walking or climbing stairs? 0  Dressing or bathing? 0  Doing errands, shopping? 1  Preparing Food and eating ? Y  Using the Toilet? N  In the past six months, have you accidently leaked urine? N  Do you have problems with loss of bowel control? N  Managing your Medications? Y  Managing your Finances? Y  Housekeeping or managing your Housekeeping? N    Patient Care Team: Marcine Matar, MD as PCP - General (Internal Medicine)  Indicate any recent Medical Services you may have received from other than [facility] providers in the past year (date may be approximate).     Assessment:   This is a routine wellness examination for [place].  Hearing/Vision screen No results  found.  Dietary issues and exercise activities discussed: Current Exercise Habits: The patient does not participate in regular exercise at present   Goals Addressed   None    Depression Screen    06/02/2022    2:26 PM 05/04/2022   11:28 AM 12/29/2021    2:15 PM  PHQ 2/9 Scores  PHQ - 2 Score 0 2 0  PHQ- 9 Score  2 0    Fall Risk    06/02/2022    2:26 PM 05/04/2022   11:25 AM 12/29/2021    1:56 PM  Fall Risk  Falls in the past year? 0 0 0  Number falls in past yr: 0 0 0  Injury with Fall? 0 0 0  Risk for fall due to : No Fall Risks No Fall Risks     FALL RISK PREVENTION PERTAINING TO THE HOME:  Any stairs in or around the home? Yes  If so, are there any without handrails? No  Home free of loose throw rugs in walkways, pet beds, electrical cords, etc? Yes  Adequate lighting in your home to reduce risk of falls? Yes   ASSISTIVE DEVICES UTILIZED TO PREVENT FALLS:  Life alert? No  Use of a cane, walker or w/c? No  Grab bars in the bathroom? No  Shower chair or bench in shower? No  Elevated toilet seat or a handicapped toilet? No   TIMED UP AND GO:  Was the test performed? No .  Length of time to ambulate 10 feet: N/A sec.   Gait slow and steady without use of assistive device  Cognitive Function:    06/02/2022    2:26 PM  MMSE - Mini Mental State Exam  Not completed: Unable to complete        06/02/2022    2:26 PM  6CIT Screen  What Year? 0 points  What month? 0 points  What time? 0 points  Count back from 20 0 points  Months in reverse 0 points  Repeat phrase 0 points  Total Score 0 points    Immunizations Immunization History  Administered Date(s) Administered   Influenza,inj,Quad PF,6+ Mos 05/04/2022   Tdap 12/29/2021   Zoster Recombinat (Shingrix) 12/29/2021, 05/04/2022    TDAP status: Up to date  Flu Vaccine status: Up to date  Pneumococcal vaccine status: Due, Education has been provided regarding the importance of this vaccine. Advised may  receive this vaccine at local pharmacy or Health Dept. Aware to provide a copy of the vaccination record if obtained from local pharmacy or Health Dept. Verbalized acceptance and understanding.  Covid-19 vaccine status: Declined, Education has been provided regarding the importance of this vaccine but patient still declined. Advised may receive this vaccine at local pharmacy or Health Dept.or vaccine clinic. Aware to provide a copy of the vaccination record if obtained from local pharmacy or Health Dept. Verbalized acceptance and understanding.  Qualifies for Shingles Vaccine? Yes   Zostavax completed Yes   Shingrix Completed?: Yes  Screening Tests Health Maintenance  Topic Date Due   Medicare Annual Wellness (AWV)  Never done   COLONOSCOPY (Pts 45-54yrs Insurance coverage will need to be confirmed)  Never done   COVID-19 Vaccine (1) 06/18/2022 (Originally 09/30/1961)   Lung Cancer Screening  12/04/2022   DTaP/Tdap/Td (2 - Td or Tdap) 12/30/2031   INFLUENZA VACCINE  Completed   Hepatitis C Screening  Completed   HIV Screening  Completed   Zoster Vaccines- Shingrix  Completed   HPV VACCINES  Aged Out    Health Maintenance  Health Maintenance Due  Topic Date Due   Medicare Annual Wellness (AWV)  Never done   COLONOSCOPY (Pts 45-45yrs Insurance coverage will need to be confirmed)  Never done    Colorectal cancer screening: Referral to GI placed 12/29/2021. Pt aware the office will call re: appt.  Lung Cancer Screening: (Low Dose CT Chest recommended if Age 76-80 years, 30 pack-year currently smoking OR have quit w/in 15years.) does qualify.   Lung Cancer Screening Referral: n/a  Additional Screening:  Hepatitis C Screening: does qualify; Completed 05/04/2022  Vision  Screening: Recommended annual ophthalmology exams for early detection of glaucoma and other disorders of the eye. Is the patient up to date with their annual eye exam?  Yes  Who is the provider or what is the name of  the office in which the patient attends annual eye exams?  If pt is not established with a provider, would they like to be referred to a provider to establish care? No .   Dental Screening: Recommended annual dental exams for proper oral hygiene  Community Resource Referral / Chronic Care Management: CRR required this visit?  No   CCM required this visit?  No      Plan:     I have personally reviewed and noted the following in the patient's chart:   Medical and social history Use of alcohol, tobacco or illicit drugs  Current medications and supplements including opioid prescriptions. Patient is not currently taking opioid prescriptions. Functional ability and status Nutritional status Physical activity Advanced directives List of other physicians Hospitalizations, surgeries, and ER visits in previous 12 months Vitals Screenings to include cognitive, depression, and falls Referrals and appointments  In addition, I have reviewed and discussed with patient certain preventive protocols, quality metrics, and best practice recommendations. A written personalized care plan for preventive services as well as general preventive health recommendations were provided to patient.     Gomez Cleverly, [place]   06/02/2022   Nurse Notes: I spent 30 minutes on this telephone encounter   I have reviewed and agree with documentation as done by CMA Magdalen Spatz on this pt.  CT of chest will be ordered on f/u visit with me as documented in my last office note. Dr. Karle Plumber

## 2022-06-02 NOTE — Patient Instructions (Addendum)
[name] , Thank you for taking time to come for your Medicare Wellness Visit. I appreciate your ongoing commitment to your health goals. Please review the following plan we discussed and let me know if I can assist you in the future.   These are the goals we discussed:  Goals   None     This is a list of the screening recommended for you and due dates:  Health Maintenance  Topic Date Due   Medicare Annual Wellness Visit  Never done   Colon Cancer Screening  Never done   COVID-19 Vaccine (1) 06/18/2022*   Screening for Lung Cancer  12/04/2022   DTaP/Tdap/Td vaccine (2 - Td or Tdap) 12/30/2031   Flu Shot  Completed   Hepatitis C Screening: USPSTF Recommendation to screen - Ages 18-79 yo.  Completed   HIV Screening  Completed   Zoster (Shingles) Vaccine  Completed   HPV Vaccine  Aged Out  *Topic was postponed. The date shown is not the original due date.  Health Maintenance, Male Adopting a healthy lifestyle and getting preventive care are important in promoting health and wellness. Ask your health care provider about: The right schedule for you to have regular tests and exams. Things you can do on your own to prevent diseases and keep yourself healthy. What should I know about diet, weight, and exercise? Eat a healthy diet  Eat a diet that includes plenty of vegetables, fruits, low-fat dairy products, and lean protein. Do not eat a lot of foods that are high in solid fats, added sugars, or sodium. Maintain a healthy weight Body mass index (BMI) is a measurement that can be used to identify possible weight problems. It estimates body fat based on height and weight. Your health care provider can help determine your BMI and help you achieve or maintain a healthy weight. Get regular exercise Get regular exercise. This is one of the most important things you can do for your health. Most adults should: Exercise for at least 150 minutes each week. The exercise should increase your  heart rate and make you sweat (moderate-intensity exercise). Do strengthening exercises at least twice a week. This is in addition to the moderate-intensity exercise. Spend less time sitting. Even light physical activity can be beneficial. Watch cholesterol and blood lipids Have your blood tested for lipids and cholesterol at 64 years of age, then have this test every 5 years. You may need to have your cholesterol levels checked more often if: Your lipid or cholesterol levels are high. You are older than 64 years of age. You are at high risk for heart disease. What should I know about cancer screening? Many types of cancers can be detected early and may often be prevented. Depending on your health history and family history, you may need to have cancer screening at various ages. This may include screening for: Colorectal cancer. Prostate cancer. Skin cancer. Lung cancer. What should I know about heart disease, diabetes, and high blood pressure? Blood pressure and heart disease High blood pressure causes heart disease and increases the risk of stroke. This is more likely to develop in people who have high blood pressure readings or are overweight. Talk with your health care provider about your target blood pressure readings. Have your blood pressure checked: Every 3-5 years if you are 68-62 years of age. Every year if you are 10 years old or older. If you are between the ages of 79 and 36 and are a current or former  smoker, ask your health care provider if you should have a one-time screening for abdominal aortic aneurysm (AAA). Diabetes Have regular diabetes screenings. This checks your fasting blood sugar level. Have the screening done: Once every three years after age 43 if you are at a normal weight and have a low risk for diabetes. More often and at a younger age if you are overweight or have a high risk for diabetes. What should I know about preventing infection? Hepatitis B If you  have a higher risk for hepatitis B, you should be screened for this virus. Talk with your health care provider to find out if you are at risk for hepatitis B infection. Hepatitis C Blood testing is recommended for: Everyone born from [birth year] through 1967. Anyone with known risk factors for hepatitis C. Sexually transmitted infections (STIs) You should be screened each year for STIs, including gonorrhea and chlamydia, if: You are sexually active and are younger than 64 years of age. You are older than 64 years of age and your health care provider tells you that you are at risk for this type of infection. Your sexual activity has changed since you were last screened, and you are at increased risk for chlamydia or gonorrhea. Ask your health care provider if you are at risk. Ask your health care provider about whether you are at high risk for HIV. Your health care provider may recommend a prescription medicine to help prevent HIV infection. If you choose to take medicine to prevent HIV, you should first get tested for HIV. You should then be tested every 3 months for as long as you are taking the medicine. Follow these instructions at home: Alcohol use Do not drink alcohol if your health care provider tells you not to drink. If you drink alcohol: Limit how much you have to 0-2 drinks a day. Know how much alcohol is in your drink. In the [place] one drink equals one 12 oz bottle of beer (355 mL), one 5 oz glass of wine (148 mL), or one 1 oz glass of hard liquor (44 mL). Lifestyle Do not use any products that contain nicotine or tobacco. These products include cigarettes, chewing tobacco, and vaping devices, such as e-cigarettes. If you need help quitting, ask your health care provider. Do not use street drugs. Do not share needles. Ask your health care provider for help if you need support or information about quitting drugs. General instructions Schedule regular health, dental, and eye exams. Stay  current with your vaccines. Tell your health care provider if: You often feel depressed. You have ever been abused or do not feel safe at home. Summary Adopting a healthy lifestyle and getting preventive care are important in promoting health and wellness. Follow your health care provider's instructions about healthy diet, exercising, and getting tested or screened for diseases. Follow your health care provider's instructions on monitoring your cholesterol and blood pressure. This information is not intended to replace advice given to you by your health care provider. Make sure you discuss any questions you have with your health care provider. Document Revised: 10/04/2020 Document Reviewed: 10/04/2020 Elsevier Patient Education  [place].

## 2022-06-13 ENCOUNTER — Encounter: Payer: Self-pay | Admitting: Physician Assistant

## 2022-07-03 ENCOUNTER — Ambulatory Visit (INDEPENDENT_AMBULATORY_CARE_PROVIDER_SITE_OTHER): Payer: 59 | Admitting: Physician Assistant

## 2022-07-03 ENCOUNTER — Encounter: Payer: Self-pay | Admitting: Physician Assistant

## 2022-07-03 VITALS — BP 100/74 | HR 76 | Ht 64.0 in | Wt 128.5 lb

## 2022-07-03 DIAGNOSIS — Z1212 Encounter for screening for malignant neoplasm of rectum: Secondary | ICD-10-CM

## 2022-07-03 DIAGNOSIS — Z1211 Encounter for screening for malignant neoplasm of colon: Secondary | ICD-10-CM

## 2022-07-03 DIAGNOSIS — Z01818 Encounter for other preprocedural examination: Secondary | ICD-10-CM | POA: Diagnosis not present

## 2022-07-03 MED ORDER — NA SULFATE-K SULFATE-MG SULF 17.5-3.13-1.6 GM/177ML PO SOLN: ORAL | 0 refills | Status: DC

## 2022-07-03 NOTE — Progress Notes (Signed)
Addendum: Reviewed and agree with assessment and management plan. Alson Mcpheeters M, MD  

## 2022-07-03 NOTE — Progress Notes (Signed)
Chief Complaint: Discuss colonoscopy  HPI:    Dale Ross is a 62 year old African-American male with a past medical history as listed below including anxiety and cognitive disability, who was referred to me by Ladell Pier, MD for discussion of a colonoscopy.    05/04/2022 patient seen by PCP.  At that time his cousin and caretaker Dale Ross was invited to join them at the end.  They discussed alcohol use disorder and he reported he drank "now and then", but nothing like he was drinking before.  It was recommended that he see [place] for screening colonoscopy.    Today, the patient presents to clinic accompanied by his caretaker, he tells me that he is due for colonoscopy.  He tells me he is never had one before.  No GI complaints or concerns.    Denies fever, chills, weight loss, nausea, vomiting, heartburn, reflux, abdominal pain or change in bowel habits.  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     vitamin B-12 (CYANOCOBALAMIN) 1000 MCG tablet Take 1,000 mcg by mouth daily.     No current facility-administered medications for this visit.    Allergies as of 07/03/2022 - Review Complete 06/02/2022  Allergen Reaction Noted   No known allergies  12/24/2015    No family  history on file.  Social History   Socioeconomic History   Marital status: Single    Spouse name: Not on file   Number of children: Not on file   Years of education: Not on file   Highest education level: Not on file  Occupational History   Not on file  Tobacco Use   Smoking status: Every Day    Packs/day: 1.50    Years: 36.00    Total pack years: 54.00    Types: Cigarettes   Smokeless tobacco: Current  Substance and Sexual Activity   Alcohol use: Yes    Alcohol/week: 4.0 standard drinks of alcohol    Types: 4 Cans of beer per week    Comment: daily   Drug use: No    Comment: quit   Sexual activity: Not on file  Other Topics Concern   Not on file  Social History Narrative   Not on file   Social Determinants of Health   Financial Resource Strain: Low Risk  (06/02/2022)   Overall Financial Resource Strain (CARDIA)    Difficulty of Paying Living Expenses: Not hard at all  Food Insecurity: Food Insecurity Present (06/02/2022)   Hunger Vital Sign    Worried About Running Out of Food in the Last Year: Sometimes true    Ran Out of Food in the Last Year: Sometimes true  Transportation Needs: No Transportation Needs (06/02/2022)  PRAPARE - Hydrologist (Medical): No    Lack of Transportation (Non-Medical): No  Physical Activity: Inactive (06/02/2022)   Exercise Vital Sign    Days of Exercise per Week: 0 days    Minutes of Exercise per Session: 0 min  Stress: No Stress Concern Present (06/02/2022)   [place]    Feeling of Stress : Not at all  Social Connections: Socially Isolated (06/02/2022)   Social Connection and Isolation Panel [NHANES]    Frequency of Communication with Friends and Family: Never    Frequency of Social Gatherings with Friends and Family: Never    Attends Religious Services: Never    Marine scientist or Organizations: No    Attends Archivist  Meetings: Never    Marital Status: Never married  Intimate Partner Violence: Not At Risk (06/02/2022)   Humiliation, Afraid, Rape, and Kick questionnaire    Fear of Current or Ex-Partner: No    Emotionally Abused: No    Physically Abused: No    Sexually Abused: No    Review of Systems:    Constitutional: No weight loss, fever or chills Skin: No rash Cardiovascular: No chest pain  Respiratory: No SOB  Gastrointestinal: See HPI and otherwise negative Genitourinary: No dysuria  Neurological: No headache, dizziness or syncope Musculoskeletal: No new muscle or joint pain Hematologic: No bleeding  Psychiatric: +cognitive disability   Physical Exam:  Vital signs: BP 100/74   Pulse 76   Ht 5\' 4"  (1.626 m)   Wt 128 lb 8 oz (58.3 kg)   BMI 22.06 kg/m    Constitutional:   Pleasant AA male appears to be in NAD, Well developed, Well nourished, alert and cooperative Head:  Normocephalic and atraumatic. Eyes:   PEERL, EOMI. No icterus. Conjunctiva pink. Ears:  Normal auditory acuity. Neck:  Supple Throat: Oral cavity and pharynx without inflammation, swelling or lesion.  Respiratory: Respirations even and unlabored. Lungs clear to auscultation bilaterally.   No wheezes, crackles, or rhonchi.  Cardiovascular: Normal S1, S2. No MRG. Regular rate and rhythm. No peripheral edema, cyanosis or pallor.  Gastrointestinal:  Soft, nondistended, nontender. No rebound or guarding. Normal bowel sounds. No appreciable masses or hepatomegaly. Rectal:  Not performed.  Msk:  Symmetrical without gross deformities. Without edema, no deformity or joint abnormality.  Neurologic:  Alert and  oriented x4;  grossly normal neurologically.  Skin:   Dry and intact without significant lesions or rashes. Psychiatric: Demonstrates good judgement and reason without abnormal affect or behaviors.  RELEVANT LABS AND IMAGING: CBC    Component Value Date/Time   WBC 7.3 12/03/2021 1114   RBC 4.52 12/03/2021 1114   HGB  15.6 12/03/2021 1114   HCT 45.4 12/03/2021 1114   PLT 410 (H) 12/03/2021 1114   MCV 100.4 (H) 12/03/2021 1114   MCH 34.5 (H) 12/03/2021 1114   MCHC 34.4 12/03/2021 1114   RDW 12.1 12/03/2021 1114   LYMPHSABS 2.1 12/17/2015 1440   MONOABS 0.5 12/17/2015 1440   EOSABS 0.2 12/17/2015 1440   BASOSABS 0.0 12/17/2015 1440    CMP     Component Value Date/Time   NA 139 12/03/2021 1114   K 4.0 12/03/2021 1114   CL 103 12/03/2021 1114   CO2 23 12/03/2021 1114   GLUCOSE 95 12/03/2021 1114   BUN 6 (L) 12/03/2021 1114   CREATININE 0.89 12/03/2021 1114   CALCIUM 9.0 12/03/2021 1114   PROT 7.7  12/29/2021 1507   ALBUMIN 4.9 12/29/2021 1507   AST 32 12/29/2021 1507   ALT 35 12/29/2021 1507   ALKPHOS 121 12/29/2021 1507   BILITOT 0.4 12/29/2021 1507   GFRNONAA >60 12/03/2021 1114   GFRAA >60 12/17/2015 1440    Assessment: 1.  Screening for colorectal cancer: Patient is 64 and never had a screening colonoscopy  Plan: 1.  Scheduled patient for screening colonoscopy in the [facility] with Dr. Hilarie Fredrickson.  Did provide the patient a detailed distal risks for the procedure and he agrees to proceed. Patient is appropriate for endoscopic procedure(s) in the ambulatory ([facility]) setting.  2.  Patient to follow in clinic per recommendations after time of colonoscopy.  Ellouise Newer, PA-C [facility] 07/03/2022, 9:09 AM  Cc: Ladell Pier, MD

## 2022-07-03 NOTE — Patient Instructions (Addendum)
You have been scheduled for a colonoscopy. Please follow written instructions given to you at your visit today.  Please pick up your prep supplies at the pharmacy within the next 1-3 days. If you use inhalers (even only as needed), please bring them with you on the day of your procedure.   If your blood pressure at your visit was 140/90 or greater, please contact your primary care physician to follow up on this.  _______________________________________________________  If you are age 64 or older, your body mass index should be between 23-30. Your Body mass index is 22.06 kg/m. If this is out of the aforementioned range listed, please consider follow up with your Primary Care Provider.  If you are age 68 or younger, your body mass index should be between 19-25. Your Body mass index is 22.06 kg/m. If this is out of the aformentioned range listed, please consider follow up with your Primary Care Provider.   ________________________________________________________  The  GI providers would like to encourage you to use [facility] to communicate with providers for non-urgent requests or questions.  Due to long hold times on the telephone, sending your provider a message by [facility] may be a faster and more efficient way to get a response.  Please allow 48 business hours for a response.  Please remember that this is for non-urgent requests.   Due to recent changes in healthcare laws, you may see the results of your imaging and laboratory studies on MyChart before your provider has had a chance to review them.  We understand that in some cases there may be results that are confusing or concerning to you. Not all laboratory results come back in the same time frame and the provider may be waiting for multiple results in order to interpret others.  Please give [place] 48 hours in order for your provider to thoroughly review all the results before contacting the office for clarification of your results.     Thank you for entrusting me with your care and choosing [facility].  Ellouise Newer PA-C

## 2022-07-31 ENCOUNTER — Encounter: Payer: Self-pay | Admitting: Internal Medicine

## 2022-08-08 ENCOUNTER — Telehealth: Payer: Self-pay | Admitting: Physician Assistant

## 2022-08-08 ENCOUNTER — Encounter: Payer: 59 | Admitting: Internal Medicine

## 2022-08-08 ENCOUNTER — Telehealth: Payer: Self-pay | Admitting: Internal Medicine

## 2022-08-08 ENCOUNTER — Telehealth: Payer: Self-pay

## 2022-08-08 ENCOUNTER — Other Ambulatory Visit: Payer: Self-pay

## 2022-08-08 MED ORDER — NA SULFATE-K SULFATE-MG SULF 17.5-3.13-1.6 GM/177ML PO SOLN: ORAL | 0 refills | Status: DC

## 2022-08-08 NOTE — Telephone Encounter (Signed)
Patient called requesting prep sent to a different pharmacy advised patient of change prep was sent to [facility] on [address] in [place].

## 2022-08-08 NOTE — Telephone Encounter (Signed)
Patients prep for procedure was sent to the wrong pharmacy for his procedure that was scheduled for today at 2:00. Patient is requesting prep be sent to [facility] on the corner of [address] and [address] in [place]. Please advise.

## 2022-08-08 NOTE — Telephone Encounter (Signed)
Ok, noted. JMP 

## 2022-08-08 NOTE — Telephone Encounter (Signed)
Good Morning Dr. Norman Herrlich,   Patients sister called stating that they needed to reschedule his procedure with you today at 2:00 due to his prep being sent to the wrong pharmacy and not being able to prep.   Patient was rescheduled for 4/3 at 3:30

## 2022-08-30 ENCOUNTER — Encounter: Payer: 59 | Admitting: Internal Medicine

## 2022-08-30 ENCOUNTER — Telehealth: Payer: Self-pay | Admitting: Internal Medicine

## 2022-08-30 NOTE — Telephone Encounter (Signed)
Inbound call from [facility] (patient's friend) stating patient did not pick up his prep until Monday, did not do his liquid diet yesterday and did not start his medication. Patient was rescheduled for procedure for 5/16.

## 2022-08-30 NOTE — Telephone Encounter (Signed)
MD, Juluis Rainier

## 2022-09-12 ENCOUNTER — Telehealth: Payer: Self-pay | Admitting: *Deleted

## 2022-09-12 NOTE — Telephone Encounter (Signed)
Attempted to reach pt for pre-visit. LM with call back #. Will attempt in 10 min

## 2022-09-12 NOTE — Telephone Encounter (Signed)
2nd attempt reach pt. Since unable to reach LM with # for pt to call by end of day (per protocol) and reschedule his pre-visit or his procedure will be canceled.

## 2022-09-14 ENCOUNTER — Ambulatory Visit (AMBULATORY_SURGERY_CENTER): Payer: 59 | Admitting: *Deleted

## 2022-09-14 VITALS — Ht 64.0 in | Wt 125.0 lb

## 2022-09-14 DIAGNOSIS — Z1211 Encounter for screening for malignant neoplasm of colon: Secondary | ICD-10-CM

## 2022-09-14 NOTE — Progress Notes (Signed)
Pt's pre-visit is done over the phone . Pt's name and DOB verified at the beginning of the pre-visit.Pt gave permission to speak to friend [place]  Pt has cognitive issues   Pt denies any difficulty with ambulating.  No egg or soy allergy known to patient  No issues known to pt with past sedation with any surgeries or procedures Pt denies having issues being intubated Pt has no issues moving head neck or swallowing No FH of Malignant Hyperthermia Pt is not on diet pills Pt is not on home 02  Pt is not on blood thinners  Pt denies issues with constipation  Pt is not on dialysis Pt denies any upcoming cardiac testing Pt encouraged to use to use Singlecare or Goodrx to reduce cost  Patient's chart reviewed by Cathlyn Parsons CNRA prior to pre-visit and patient appropriate for the [facility].  Pre-visit completed and red dot placed by patient's name on their procedure day (on provider's schedule).  . Visit by phone Pt states weight is  Instructions reviewed with pt and pt states understanding. Instructed to review again prior to procedure. Pt states they will.  Instructions sent by mail

## 2022-10-09 ENCOUNTER — Ambulatory Visit (INDEPENDENT_AMBULATORY_CARE_PROVIDER_SITE_OTHER): Payer: 59 | Admitting: Podiatry

## 2022-10-09 DIAGNOSIS — M79675 Pain in left toe(s): Secondary | ICD-10-CM

## 2022-10-09 DIAGNOSIS — B351 Tinea unguium: Secondary | ICD-10-CM

## 2022-10-09 DIAGNOSIS — L602 Onychogryphosis: Secondary | ICD-10-CM

## 2022-10-09 DIAGNOSIS — M79674 Pain in right toe(s): Secondary | ICD-10-CM

## 2022-10-09 NOTE — Progress Notes (Signed)
       Subjective:  Patient ID: Dale Ross, male    DOB: 1960/08/13,  MRN: [id number]   Dale Ross presents to clinic today for:  Chief Complaint  Patient presents with   Nail Problem    RFC/Nail trim  . Patient notes nails are thick, discolored, elongated and painful in shoegear when trying to ambulate.  He has a caregiver with him for his appointment today.  He states that the nails have "gotten away from me".  States that it has been a very long time since the nails have been trimmed.  He is not able to bend to reach the nails and is too afraid he will cut his skin.  PCP is Marcine Matar, MD. last seen on 05/04/2022.  Allergies  Allergen Reactions   No Known Allergies     Review of Systems: Negative except as noted in the HPI.  Objective:  There were no vitals filed for this visit.  Dale Ross is a pleasant 62 y.o. male in NAD. AAO x 3.  Vascular Examination: Capillary refill time is 3-5 seconds to toes bilateral. Palpable pedal pulses b/l LE. Digital hair present b/l. No pedal edema b/l. Skin temperature gradient WNL b/l. No varicosities b/l. No cyanosis or clubbing noted b/l.   Dermatological Examination: Pedal skin is mildly dry b/l. No open wounds. No interdigital macerations b/l. Toenails x10 are 5-10 mm thick, discolored, dystrophic with subungual debris.  The hallux and third toenails have significant length that are curling back on themselves and pushing against the skin.  There is pain with compression of the nail plates.  They are elongated x10  Neurological Examination: Protective sensation intact bilateral LE. Vibratory sensation intact bilateral LE.  Musculoskeletal Examination: Muscle strength 5/5 to all LE muscle groups b/l.   Assessment/Plan: 1. Pain due to onychomycosis of toenails of both feet   2. Onychogryphosis     The mycotic toenails were sharply debrided x10 with sterile nail nippers and a power debriding burr to decrease  bulk/thickness and length.  Strongly recommended that the patient return on a regular basis, approximately every 3 months, to continually improve on the nail thickness and appearance so that they want to get this long and painful in the future.  He noted immediate improvement with the nails at the end of his visit.  Return in about 3 months (around 01/09/2023) for RFC.   Clerance Lav, DPM, FACFAS [facility]     [address] [place], [place] [address]                Office [phone]  Fax [phone]

## 2022-10-12 ENCOUNTER — Telehealth: Payer: Self-pay | Admitting: Internal Medicine

## 2022-10-12 ENCOUNTER — Encounter: Payer: 59 | Admitting: Internal Medicine

## 2022-10-12 NOTE — Telephone Encounter (Signed)
Patient has rescheduled procedure due to poor prep.

## 2022-10-30 ENCOUNTER — Encounter: Payer: Self-pay | Admitting: Internal Medicine

## 2022-10-30 ENCOUNTER — Ambulatory Visit: Payer: 59 | Admitting: Internal Medicine

## 2022-10-30 NOTE — Progress Notes (Deleted)
Pt's states no medical or surgical changes since previsit or office visit. 

## 2022-10-30 NOTE — Progress Notes (Signed)
Patient came in 1.5 hours late and it was made known that he had just completed his prep 20 minutes ago. He will be rescheduled for tomorrow with Dr. Myrtie Neither at 11:30

## 2022-10-30 NOTE — Progress Notes (Unsigned)
GASTROENTEROLOGY PROCEDURE H&P NOTE   Primary Care Physician: Marcine Matar, MD    Reason for Procedure:  Colon cancer screening  Plan:    Colonoscopy  Patient is appropriate for endoscopic procedure(s) in the ambulatory ([facility]) setting.  The nature of the procedure, as well as the risks, benefits, and alternatives were carefully and thoroughly reviewed with the patient. Ample time for discussion and questions allowed. The patient understood, was satisfied, and agreed to proceed.     HPI: Dale Ross is a 62 y.o. male who presents for colonoscopy.  Medical history as below.  Tolerated the prep.  No recent chest pain or shortness of breath.  No abdominal pain today.  However, he continued to consume the prep until 20 minutes before arrival.  He was also 1 hour and 14 minutes late.  We attempted to wait on the patient to facilitate his procedure but given that he is not n.p.o. for an adequate enough time the procedure will have to be canceled today. 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 Authorizing Provider  Na Sulfate-K Sulfate-Mg Sulf 17.5-3.13-1.6 GM/177ML SOLN Use as directed; may use generic; goodrx card if insurance will not cover generic  08/08/22   Unk Lightning, PA    Current Outpatient Medications  Medication Sig Dispense Refill   Na Sulfate-K Sulfate-Mg Sulf 17.5-3.13-1.6 GM/177ML SOLN Use as directed; may use generic; goodrx card if insurance will not cover generic 354 mL 0   No current facility-administered medications for this visit.    Allergies as of 10/30/2022 - Review Complete 10/30/2022  Allergen Reaction Noted   No known allergies  12/24/2015    Family History  Problem Relation Age of Onset   Colon cancer Neg Hx    Stomach cancer Neg Hx    Esophageal cancer Neg Hx    Colon polyps Neg Hx    Rectal cancer Neg Hx     Social History   Socioeconomic History   Marital status: Single    Spouse name: Not on file   Number of children: Not on file   Years of education: Not on file   Highest education level: Not on file  Occupational History   Not on file  Tobacco Use   Smoking status: Former    Packs/day: 1.50    Years: 36.00    Additional pack years: 0.00    Total pack years: 54.00    Types: Cigarettes    Quit date: 2023    Years since quitting: 1.4   Smokeless tobacco: Never  Vaping Use   Vaping Use: Never used  Substance and Sexual Activity   Alcohol use: Not Currently    Alcohol/week:  4.0 standard drinks of alcohol    Types: 4 Cans of beer per week    Comment: daily   Drug use: No    Comment: quit   Sexual activity: Not on file  Other Topics Concern   Not on file  Social History Narrative   Not on file   Social Determinants of Health   Financial Resource Strain: Low Risk  (06/02/2022)   Overall Financial Resource Strain (CARDIA)    Difficulty of Paying Living Expenses: Not hard at all  Food Insecurity: Food Insecurity Present (06/02/2022)   Hunger Vital Sign    Worried About Running Out of Food in the Last Year: Sometimes true    Ran Out of Food in the Last Year: Sometimes true  Transportation Needs: No Transportation Needs (06/02/2022)   PRAPARE - Doctor, general practice (Medical): No    Lack of Transportation (Non-Medical): No  Physical Activity: Inactive (06/02/2022)   Exercise Vital Sign    Days of Exercise per Week: 0 days    Minutes of Exercise per Session: 0 min  Stress: No Stress Concern Present (06/02/2022)   [employer] of Occupational Health - Occupational Stress Questionnaire    Feeling of Stress : Not at all  Social Connections: Socially Isolated (06/02/2022)   Social Connection and Isolation Panel [NHANES]    Frequency of Communication with Friends and Family: Never    Frequency of Social Gatherings with Friends and Family: Never    Attends Religious Services: Never    Database administrator or Organizations: No    Attends Banker Meetings: Never    Marital Status: Never married  Intimate Partner Violence: Not At Risk (06/02/2022)   Humiliation, Afraid, Rape, and Kick questionnaire    Fear of Current or Ex-Partner: No    Emotionally Abused: No    Physically Abused: No    Sexually Abused: No    Physical Exam: Vital signs in last 24 hours: @Temp  98 F (36.7 C) (Temporal)  GEN: NAD EYE: Sclerae anicteric ENT: MMM CV: Non-tachycardic Pulm: CTA b/l GI: Soft, NT/ND NEURO:  Alert & Oriented x 3   Erick Blinks, MD [facility]  10/30/2022 4:25 PM

## 2022-10-31 ENCOUNTER — Ambulatory Visit (AMBULATORY_SURGERY_CENTER): Payer: 59 | Admitting: Gastroenterology

## 2022-10-31 ENCOUNTER — Encounter: Payer: Self-pay | Admitting: Gastroenterology

## 2022-10-31 VITALS — BP 121/85 | HR 76 | Temp 96.9°F | Resp 15 | Ht 64.0 in | Wt 125.0 lb

## 2022-10-31 DIAGNOSIS — D123 Benign neoplasm of transverse colon: Secondary | ICD-10-CM | POA: Diagnosis not present

## 2022-10-31 DIAGNOSIS — Z1211 Encounter for screening for malignant neoplasm of colon: Secondary | ICD-10-CM

## 2022-10-31 MED ORDER — SODIUM CHLORIDE 0.9 % IV SOLN: 500.0000 mL | Freq: Once | INTRAVENOUS | Status: AC

## 2022-10-31 NOTE — Patient Instructions (Signed)
Discharge instructions given. Handout on polyps. Resume previous medications. YOU HAD AN ENDOSCOPIC PROCEDURE TODAY AT THE [facility]:   Refer to the procedure report that was given to you for any specific questions about what was found during the examination.  If the procedure report does not answer your questions, please call your gastroenterologist to clarify.  If you requested that your care partner not be given the details of your procedure findings, then the procedure report has been included in a sealed envelope for you to review at your convenience later.  YOU SHOULD EXPECT: Some feelings of bloating in the abdomen. Passage of more gas than usual.  Walking can help get rid of the air that was put into your GI tract during the procedure and reduce the bloating. If you had a lower endoscopy (such as a colonoscopy or flexible sigmoidoscopy) you may notice spotting of blood in your stool or on the toilet paper. If you underwent a bowel prep for your procedure, you may not have a normal bowel movement for a few days.  Please Note:  You might notice some irritation and congestion in your nose or some drainage.  This is from the oxygen used during your procedure.  There is no need for concern and it should clear up in a day or so.  SYMPTOMS TO REPORT IMMEDIATELY:  Following lower endoscopy (colonoscopy or flexible sigmoidoscopy):  Excessive amounts of blood in the stool  Significant tenderness or worsening of abdominal pains  Swelling of the abdomen that is new, acute  Fever of 100F or higher   For urgent or emergent issues, a gastroenterologist can be reached at any hour by calling [phone]. Do not use MyChart messaging for urgent concerns.    DIET:  We do recommend a small meal at first, but then you may proceed to your regular diet.  Drink plenty of fluids but you should avoid alcoholic beverages for 24 hours.  ACTIVITY:  You should plan to take it easy for the rest  of today and you should NOT DRIVE or use heavy machinery until tomorrow (because of the sedation medicines used during the test).    FOLLOW UP: Our staff will call the number listed on your records the next business day following your procedure.  We will call around 7:15- 8:00 am to check on you and address any questions or concerns that you may have regarding the information given to you following your procedure. If we do not reach you, we will leave a message.     If any biopsies were taken you will be contacted by phone or by letter within the next 1-3 weeks.  Please call us at [phone] if you have not heard about the biopsies in 3 weeks.    SIGNATURES/CONFIDENTIALITY: You and/or your care partner have signed paperwork which will be entered into your electronic medical record.  These signatures attest to the fact that that the information above on your After Visit Summary has been reviewed and is understood.  Full responsibility of the confidentiality of this discharge information lies with you and/or your care-partner.   

## 2022-10-31 NOTE — Progress Notes (Signed)
History and Physical:  This patient presents for endoscopic testing for: Encounter Diagnosis  Name Primary?   Screening for colorectal cancer Yes   Average risk for colorectal cancer.  First screening exam.  Patient was scheduled for this exam with Dr. Rhea Belton yesterday.  Moved to today due to prep issues. Patient denies chronic abdominal pain, rectal bleeding, constipation or diarrhea.   Patient is otherwise without complaints or active issues today.   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   Current Facility-Administered Medications  Medication Dose Route Frequency Provider Last Rate Last Admin   0.9 %  sodium chloride infusion  500 mL Intravenous Once Sherrilyn Rist, MD          ___________________________________________________________________ Objective   Exam:  BP 121/79   Pulse 74   Temp (!) 96.9 F (36.1 C)   Ht 5\' 4"  (1.626 m)   Wt 125 lb (56.7 kg)   SpO2 98%   BMI 21.46 kg/m   CV: regular , S1/S2 Resp: clear to auscultation bilaterally, normal RR and effort noted GI: soft, no tenderness, with active bowel sounds.   Assessment: Encounter Diagnosis  Name Primary?   Screening for colorectal cancer Yes      Plan: Colonoscopy   The benefits and risks of the planned procedure were described in detail with the patient or (when appropriate) their health care proxy.  Risks were outlined as including, but not limited to, bleeding, infection, perforation, adverse medication reaction leading to cardiac or pulmonary decompensation, pancreatitis (if ERCP).  The limitation of incomplete mucosal visualization was also discussed.  No guarantees or warranties were given.  The patient is appropriate for an endoscopic procedure in the ambulatory setting.   - Amada Jupiter, MD

## 2022-10-31 NOTE — Progress Notes (Signed)
Vss nad trans to pacu 

## 2022-10-31 NOTE — Progress Notes (Signed)
Called to room to assist during endoscopic procedure.  Patient ID and intended procedure confirmed with present staff. Received instructions for my participation in the procedure from the performing physician.  

## 2022-10-31 NOTE — Op Note (Addendum)
[facility] Patient Name: Dale Ross Procedure Date: 10/31/2022 12:02 PM MRN: [id number] Endoscopist: Sherilyn Cooter L. Myrtie Neither , MD, [id number] Age: 62 Referring MD:  Date of Birth: 1960/11/09 Gender: Male Account #: [id number] Procedure:                Colonoscopy Indications:              Screening for colorectal malignant neoplasm, This                            is the patient's first colonoscopy                           see notes - patient rescheduled from another                            provider last month for poor prep                           rescheduled from yesterday from another provider                            for late arrival and late prep completion Medicines:                Monitored Anesthesia Care Procedure:                Pre-Anesthesia Assessment:                           - Prior to the procedure, a History and Physical                            was performed, and patient medications and                            allergies were reviewed. The patient's tolerance of                            previous anesthesia was also reviewed. The risks                            and benefits of the procedure and the sedation                            options and risks were discussed with the patient.                            All questions were answered, and informed consent                            was obtained. Prior Anticoagulants: The patient has                            taken no anticoagulant or antiplatelet agents. ASA  Grade Assessment: II - A patient with mild systemic                            disease. After reviewing the risks and benefits,                            the patient was deemed in satisfactory condition to                            undergo the procedure.                           After obtaining informed consent, the colonoscope                            was passed under direct vision. Throughout the                             procedure, the patient's blood pressure, pulse, and                            oxygen saturations were monitored continuously. The                            CF [id number] #[id number] was introduced through the anus                            and advanced to the the cecum, identified by                            appendiceal orifice and ileocecal valve. The                            colonoscopy was performed without difficulty. The                            patient tolerated the procedure well. The quality                            of the bowel preparation was poor despite lavage.                            The ileocecal valve, appendiceal orifice, and                            rectum were photographed. The bowel preparation                            used was SUPREP via split dose instruction. Scope In: 12:07:37 PM Scope Out: 12:20:08 PM Scope Withdrawal Time: 0 hours 9 minutes 32 seconds  Total Procedure Duration: 0 hours 12 minutes 31 seconds  Findings:                 The perianal and digital rectal examinations were  normal.                           A 6 mm polyp was found in the transverse colon. The                            polyp was sessile. The polyp was removed with a                            cold snare. Resection and retrieval were complete.                           Adherent stool was found in the entire colon,                            interfering with visualization. Insufficient                            clearnace with extensive lavage.                           The exam was otherwise without abnormality on                            direct and retroflexion views. Complications:            No immediate complications. Estimated Blood Loss:     Estimated blood loss was minimal. Impression:               - Preparation of the colon was poor.                           - One 6 mm polyp in the transverse colon, removed                             with a cold snare. Resected and retrieved.                           - Stool in the entire examined colon.                           - The examination was otherwise normal on direct                            and retroflexion views. Recommendation:           - Patient has a contact number available for                            emergencies. The signs and symptoms of potential                            delayed complications were discussed with the                            patient. Return  to normal activities tomorrow.                            Written discharge instructions were provided to the                            patient.                           - Resume previous diet.                           - Continue present medications.                           - Await pathology results.                           - Repeat colonoscopy in 1 year for surveillance-                            poor prep. GOLYTELY PREP FOR NEXT COLONOSCOPY                            (patient would appear to benefit from a caregiver                            present to assist with bowel prep instructions) Sherilyn Cooter L. Myrtie Neither, MD 10/31/2022 82:95:62 PM This report has been signed electronically.

## 2022-10-31 NOTE — Progress Notes (Signed)
Pt's states no medical or surgical changes since previsit or office visit. 

## 2022-11-01 ENCOUNTER — Telehealth: Payer: Self-pay

## 2022-11-01 NOTE — Telephone Encounter (Signed)
Follow up call placed, VM obtained and message left. 

## 2022-11-02 ENCOUNTER — Encounter: Payer: Self-pay | Admitting: Gastroenterology

## 2023-01-15 ENCOUNTER — Ambulatory Visit: Payer: 59 | Admitting: Podiatry

## 2023-02-05 ENCOUNTER — Encounter: Payer: 59 | Admitting: Podiatry

## 2023-02-07 NOTE — Progress Notes (Signed)
Patient was a no-show for his scheduled 02/05/23 appt.

## 2023-07-03 ENCOUNTER — Ambulatory Visit: Payer: 59 | Admitting: Internal Medicine

## 2023-08-30 ENCOUNTER — Ambulatory Visit: Attending: Physician Assistant | Admitting: Physician Assistant

## 2023-08-30 VITALS — BP 104/68 | HR 81 | Temp 97.9°F | Ht 64.0 in | Wt 134.0 lb

## 2023-08-30 DIAGNOSIS — D7589 Other specified diseases of blood and blood-forming organs: Secondary | ICD-10-CM | POA: Diagnosis not present

## 2023-08-30 DIAGNOSIS — R052 Subacute cough: Secondary | ICD-10-CM

## 2023-08-30 DIAGNOSIS — M25551 Pain in right hip: Secondary | ICD-10-CM

## 2023-08-30 MED ORDER — BENZONATATE 100 MG PO CAPS
200.0000 mg | ORAL_CAPSULE | Freq: Three times a day (TID) | ORAL | 0 refills | Status: AC | PRN
Start: 2023-08-30 — End: ?

## 2023-08-30 MED ORDER — MELOXICAM 7.5 MG PO TABS
7.5000 mg | ORAL_TABLET | Freq: Every day | ORAL | 0 refills | Status: AC
Start: 2023-08-30 — End: ?

## 2023-08-30 NOTE — Progress Notes (Signed)
 Patient ID: Dale Ross, male   DOB: 04/29/1961, 63 y.o.   MRN: [id number]   Dale Ross, is a 63 y.o. male  GEX:[id number]  WNU:[id number]  DOB - 12/30/1960  Chief Complaint  Patient presents with   Hip Pain    Intermittent R hip pain since hip sx       Subjective:   Dale Ross is a 63 y.o. male here today for pain in R hip for about 1 week.  H/o B replacement in 2017-Dr [facility]  Pain with internal rotation the last 2 weeks.  Worse with cough.  NKI.  No fevers.  Not affecting ambulation.  No groin or scrotal swelling.    No problems updated.  ALLERGIES: Allergies  Allergen Reactions   No Known Allergies     PAST MEDICAL HISTORY: Past Medical History:  Diagnosis Date   Anxiety    Arthritis    Cognitive deficits    learning disabilities   Dental caries    Seasonal allergies     MEDICATIONS AT HOME: Prior to Admission medications   Medication Sig Start Date End Date Taking? Authorizing Provider  benzonatate (TESSALON) 100 MG capsule Take 2 capsules (200 mg total) by mouth 3 (three) times daily as needed. 08/30/23  Yes Ellene Bloodsaw, Marzella Schlein, PA-C  meloxicam (MOBIC) 7.5 MG tablet Take 1 tablet (7.5 mg total) by mouth daily. With food prn pain 08/30/23  Yes Nur Krasinski M, PA-C    ROS: Neg HEENT Neg resp Neg cardiac Neg GI Neg GU Neg psych Neg neuro  Objective:   Vitals:   08/30/23 1010  BP: 104/68  Pulse: 81  Temp: 97.9 F (36.6 C)  TempSrc: Oral  SpO2: 98%  Weight: 134 lb (60.8 kg)  Height: 5\' 4"  (1.626 m)   Exam General appearance : Awake, alert, not in any distress. Speech Clear. Not toxic looking HEENT: Atraumatic and NormocephalicNeck: Supple, no JVD. No cervical lymphadenopathy.  Chest: Good air entry bilaterally, CTAB.  No rales/rhonchi/wheezing CVS: S1 S2 regular, no murmurs.  Extremities: B/L Lower Ext shows no edema, both legs are warm to touch.  B hips with normal external rotation.  Limited internal rotation on R.  No TTP over  Trochanteric bursa.  No redness or swelling Neurology: Awake alert, and oriented X 3, CN II-XII intact, Non focal Skin: No Rash  Data Review No results found for: "HGBA1C"  Assessment & Plan   1. Pain in right hip (Primary) Decrease internal rotation.  Hip replacement 2017.  No sign infection - meloxicam (MOBIC) 7.5 MG tablet; Take 1 tablet (7.5 mg total) by mouth daily. With food prn pain  Dispense: 30 tablet; Refill: 0 - CBC with Differential - Comprehensive metabolic panel - Ambulatory referral to Orthopedic Surgery  2. Subacute cough - benzonatate (TESSALON) 100 MG capsule; Take 2 capsules (200 mg total) by mouth 3 (three) times daily as needed.  Dispense: 40 capsule; Refill: 0  3. Macrocytosis without anemia - CBC with Differential    Return in about 6 months (around 02/29/2024) for PCP for chronic conditions.  The patient was given clear instructions to go to ER or return to medical center if symptoms don't improve, worsen or new problems develop. The patient verbalized understanding. The patient was told to call to get lab results if they haven't heard anything in the next week.      Georgian Co, PA-C [facility] [place], [place] [phone]   08/30/2023, 12:17 PM

## 2023-08-30 NOTE — Patient Instructions (Signed)
 Hip Pain The hip is the joint between the upper legs and the lower pelvis. The bones, cartilage, tendons, and muscles of your hip joint support your body and allow you to move around. Hip pain can range from a minor ache to severe pain in one or both of your hips. The pain may be felt on the inside of the hip joint near the groin, or on the outside near the buttocks and upper thigh. You may also have swelling or stiffness in your hip area. Follow these instructions at home: Managing pain, stiffness, and swelling     If told, put ice on the painful area. Put ice in a plastic bag. Place a towel between your skin and the bag. Leave the ice on for 20 minutes, 2-3 times a day. If told, apply heat to the affected area as often as told by your health care provider. Use the heat source that your provider recommends, such as a moist heat pack or a heating pad. Place a towel between your skin and the heat source. Leave the heat on for 20-30 minutes. If your skin turns bright red, remove the ice or heat right away to prevent skin damage. The risk of damage is higher if you cannot feel pain, heat, or cold. Activity Do exercises as told by your provider. Avoid activities that cause pain. General instructions  Take over-the-counter and prescription medicines only as told by your provider. Keep a journal of your symptoms. Write down: How often you have hip pain. The location of your pain. What the pain feels like. What makes the pain worse. Sleep with a pillow between your legs on your most comfortable side. Keep all follow-up visits. Your provider will monitor your pain and activity. Contact a health care provider if: You cannot put weight on your leg. Your pain or swelling gets worse after a week. It gets harder to walk. You have a fever. Get help right away if: You fall. You have a sudden increase in pain and swelling in your hip. Your hip is red or swollen or very tender to touch. This  information is not intended to replace advice given to you by your health care provider. Make sure you discuss any questions you have with your health care provider. Document Revised: 01/17/2022 Document Reviewed: 01/17/2022 Elsevier Patient Education  2024 [employer].

## 2023-08-31 LAB — CBC WITH DIFFERENTIAL/PLATELET
Basophils Absolute: 0 10*3/uL (ref 0.0–0.2)
Basos: 1 %
EOS (ABSOLUTE): 0.2 10*3/uL (ref 0.0–0.4)
Eos: 4 %
Hematocrit: 42.7 % (ref 37.5–51.0)
Hemoglobin: 14.5 g/dL (ref 13.0–17.7)
Immature Grans (Abs): 0 10*3/uL (ref 0.0–0.1)
Immature Granulocytes: 1 %
Lymphocytes Absolute: 1.7 10*3/uL (ref 0.7–3.1)
Lymphs: 39 %
MCH: 33.7 pg — ABNORMAL HIGH (ref 26.6–33.0)
MCHC: 34 g/dL (ref 31.5–35.7)
MCV: 99 fL — ABNORMAL HIGH (ref 79–97)
Monocytes Absolute: 0.6 10*3/uL (ref 0.1–0.9)
Monocytes: 13 %
Neutrophils Absolute: 1.8 10*3/uL (ref 1.4–7.0)
Neutrophils: 42 %
Platelets: 315 10*3/uL (ref 150–450)
RBC: 4.3 x10E6/uL (ref 4.14–5.80)
RDW: 11 % — ABNORMAL LOW (ref 11.6–15.4)
WBC: 4.3 10*3/uL (ref 3.4–10.8)

## 2023-08-31 LAB — COMPREHENSIVE METABOLIC PANEL WITH GFR
ALT: 50 IU/L — ABNORMAL HIGH (ref 0–44)
AST: 41 IU/L — ABNORMAL HIGH (ref 0–40)
Albumin: 4.6 g/dL (ref 3.9–4.9)
Alkaline Phosphatase: 97 IU/L (ref 44–121)
BUN/Creatinine Ratio: 8 — ABNORMAL LOW (ref 10–24)
BUN: 8 mg/dL (ref 8–27)
Bilirubin Total: 0.4 mg/dL (ref 0.0–1.2)
CO2: 23 mmol/L (ref 20–29)
Calcium: 9.6 mg/dL (ref 8.6–10.2)
Chloride: 103 mmol/L (ref 96–106)
Creatinine, Ser: 0.97 mg/dL (ref 0.76–1.27)
Globulin, Total: 2.6 g/dL (ref 1.5–4.5)
Glucose: 102 mg/dL — ABNORMAL HIGH (ref 70–99)
Potassium: 4.7 mmol/L (ref 3.5–5.2)
Sodium: 140 mmol/L (ref 134–144)
Total Protein: 7.2 g/dL (ref 6.0–8.5)
eGFR: 87 mL/min/{1.73_m2} (ref >59)

## 2023-09-03 ENCOUNTER — Encounter: Payer: Self-pay | Admitting: Podiatry

## 2023-09-03 ENCOUNTER — Ambulatory Visit (INDEPENDENT_AMBULATORY_CARE_PROVIDER_SITE_OTHER): Admitting: Podiatry

## 2023-09-03 DIAGNOSIS — M79675 Pain in left toe(s): Secondary | ICD-10-CM | POA: Diagnosis not present

## 2023-09-03 DIAGNOSIS — B351 Tinea unguium: Secondary | ICD-10-CM

## 2023-09-03 DIAGNOSIS — M79674 Pain in right toe(s): Secondary | ICD-10-CM | POA: Diagnosis not present

## 2023-09-03 NOTE — Progress Notes (Signed)
       Subjective:  Patient ID: Dale Ross, male    DOB: 04/23/1961,  MRN: [id number]   Dale Ross presents to clinic today for:  Chief Complaint  Patient presents with   Nail Problem    Pt stated that he is here to get his nails trimmed    Patient notes nails are thick, discolored, elongated and painful in shoegear when trying to ambulate.    PCP is Marcine Matar, MD.  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  Objective:  Dale Ross is a pleasant 63 y.o. male in NAD. AAO x 3.  Vascular Examination: Capillary refill time is 3-5 seconds to toes bilateral. Palpable pedal pulses b/l LE. Digital hair present b/l.  Skin temperature gradient WNL b/l. No varicosities b/l. No cyanosis noted b/l.   Dermatological Examination: Pedal skin with normal turgor, texture and tone b/l. No open wounds. No interdigital macerations b/l. Toenails x10 are 3mm thick, discolored, dystrophic with subungual debris. There is pain with compression of the nail plates.  They are elongated x10  Assessment/Plan: 1. Pain due to onychomycosis of toenails of both feet    The mycotic toenails were sharply debrided x10 with sterile nail nippers and a power debriding burr to decrease bulk/thickness and length.    Return in about 4 months (around  01/03/2024) for RFC.   Clerance Lav, DPM, FACFAS [facility]     [address] [place], [place] [address]                Office [phone]  Fax [phone]

## 2023-09-06 ENCOUNTER — Ambulatory Visit: Payer: Self-pay

## 2023-09-06 NOTE — Telephone Encounter (Signed)
 Per labs notes, pt is to refrain from using tylenol products. Pt sister calling at this time to question what medications he can take for pain if not tylenol. Advised will reach out to PCP as pt does take meloxicam.  Copied from CRM [id number]. Topic: Clinical - Lab/Test Results >> Sep 06, 2023  2:35 PM [place] B wrote: Reason for CRM: pt's sister along with pt called in. I gave lab results and info about not taking tylenol, so they want to know what to take instead Reason for Disposition . General information question, no triage required and triager able to answer question  Protocols used: Information Only Call - No Triage-A-AH

## 2023-09-06 NOTE — Telephone Encounter (Signed)
 Routing to PCP for review.

## 2023-09-06 NOTE — Telephone Encounter (Signed)
 Take the Meloxicam for pain.

## 2023-09-07 NOTE — Telephone Encounter (Signed)
 Called the patient's main number listed but is currently the wrong number. Called the sister, Agustin Cree  (authorized to receive information per DPR on file) but no answer. Unable to LVM due to VM not set up.

## 2023-09-10 NOTE — Telephone Encounter (Signed)
 Called the sister, Estel Heir (authorized to receive information per DPR on file) but no answer. Unable to LVM due to VM not set up.

## 2023-09-11 NOTE — Telephone Encounter (Signed)
 Called the sister, Estel Heir (authorized to receive information per DPR on file) but no answer. Unable to LVM due to VM not set up.

## 2023-09-12 ENCOUNTER — Telehealth: Payer: Self-pay

## 2023-09-12 NOTE — Telephone Encounter (Signed)
 Call to patient's sister per her request . Patient's sister is not on patient 's DPR ,unable to reach. General message left to return call .

## 2023-09-12 NOTE — Telephone Encounter (Signed)
 Copied from CRM [id number]. Topic: Clinical - Medication Question >> Sep 12, 2023  8:27 AM Carlatta H wrote: Reason for CRM: Please call patient sister to advised of best pain medication to take [phone]

## 2023-11-20 ENCOUNTER — Encounter: Payer: Self-pay | Admitting: Gastroenterology

## 2024-01-07 ENCOUNTER — Encounter: Admitting: Podiatry

## 2024-01-07 NOTE — Progress Notes (Signed)
 Patient was a no-show for his scheduled appointment this morning

## 2024-03-04 ENCOUNTER — Ambulatory Visit: Admitting: Internal Medicine
